# Patient Record
Sex: Female | Born: 1955 | Race: White | Hispanic: No | Marital: Married | Smoking: Current every day smoker
Health system: Southern US, Community
[De-identification: ages and names within clinical notes are randomized; demographics above are authoritative.]

## PROBLEM LIST (undated history)

## (undated) DIAGNOSIS — F419 Anxiety disorder, unspecified: Secondary | ICD-10-CM

## (undated) DIAGNOSIS — F32A Depression, unspecified: Secondary | ICD-10-CM

## (undated) DIAGNOSIS — I10 Essential (primary) hypertension: Secondary | ICD-10-CM

## (undated) DIAGNOSIS — G4730 Sleep apnea, unspecified: Secondary | ICD-10-CM

## (undated) DIAGNOSIS — J449 Chronic obstructive pulmonary disease, unspecified: Secondary | ICD-10-CM

## (undated) HISTORY — PX: ABDOMINAL HYSTERECTOMY: SHX81

---

## 2004-08-03 ENCOUNTER — Ambulatory Visit: Payer: Self-pay | Admitting: Family Medicine

## 2004-08-11 ENCOUNTER — Ambulatory Visit: Payer: Self-pay | Admitting: General Surgery

## 2005-07-27 ENCOUNTER — Ambulatory Visit: Payer: Self-pay | Admitting: Oncology

## 2005-08-09 LAB — CBC WITH DIFFERENTIAL (CANCER CENTER ONLY)
BASO#: 0.1 10*3/uL (ref 0.0–0.2)
EOS%: 2.3 % (ref 0.0–7.0)
HCT: 38.7 % (ref 34.8–46.6)
LYMPH#: 3.4 10*3/uL — ABNORMAL HIGH (ref 0.9–3.3)
NEUT%: 47.9 % (ref 39.6–80.0)
RDW: 11.6 % (ref 10.5–14.6)
WBC: 7.6 10*3/uL (ref 3.9–10.0)

## 2005-08-12 LAB — SPEP & IFE WITH QIG
Alpha-1-Globulin: 3.8 % (ref 2.9–4.9)
Alpha-2-Globulin: 10.6 % (ref 7.1–11.8)
Beta 2: 4.4 % (ref 3.2–6.5)
Beta Globulin: 5.2 % (ref 4.7–7.2)
Gamma Globulin: 16.2 % (ref 11.1–18.8)
IgA: 131 mg/dL (ref 68–378)
IgG (Immunoglobin G), Serum: 974 mg/dL (ref 694–1618)

## 2005-08-12 LAB — URIC ACID: Uric Acid, Serum: 6.1 mg/dL (ref 2.4–7.0)

## 2005-08-12 LAB — COMPREHENSIVE METABOLIC PANEL
AST: 20 U/L (ref 0–37)
Alkaline Phosphatase: 76 U/L (ref 39–117)
Calcium: 11.2 mg/dL — ABNORMAL HIGH (ref 8.4–10.5)
Chloride: 107 mEq/L (ref 96–112)

## 2005-08-12 LAB — KAPPA/LAMBDA LIGHT CHAINS
Kappa:Lambda Ratio: 0.94 (ref 0.26–1.65)
Lambda Free Lght Chn: 1.21 mg/dL (ref 0.57–2.63)

## 2005-08-12 LAB — BETA 2 MICROGLOBULIN, SERUM: Beta-2 Microglobulin: 1.5 mg/L (ref 1.01–1.73)

## 2005-08-13 ENCOUNTER — Ambulatory Visit (HOSPITAL_COMMUNITY): Admission: RE | Admit: 2005-08-13 | Discharge: 2005-08-13 | Payer: Self-pay | Admitting: Internal Medicine

## 2005-08-23 LAB — UIFE/LIGHT CHAINS/TP QN, 24-HR UR
Free Kappa/Lambda Ratio: 8.5 ratio — ABNORMAL HIGH (ref 0.46–4.00)
Free Lambda Excretion/Day: 2.52 mg/d
Free Lambda Lt Chains,Ur: 0.28 mg/dL (ref 0.08–1.01)
Time: 24 hours
Total Protein, Urine-Ur/day: 29 mg/d (ref 10–140)
Total Protein, Urine: 3.2 mg/dL
Volume, Urine: 900 mL

## 2007-12-01 ENCOUNTER — Ambulatory Visit: Payer: Self-pay | Admitting: Internal Medicine

## 2010-06-01 ENCOUNTER — Ambulatory Visit: Payer: Self-pay | Admitting: Rheumatology

## 2011-06-16 ENCOUNTER — Encounter: Payer: Self-pay | Admitting: Oncology

## 2011-06-16 NOTE — Progress Notes (Signed)
Put fmla form in the registration desk.

## 2011-10-13 ENCOUNTER — Ambulatory Visit: Payer: Self-pay | Admitting: Internal Medicine

## 2015-09-16 ENCOUNTER — Other Ambulatory Visit: Payer: Self-pay | Admitting: Internal Medicine

## 2015-09-16 DIAGNOSIS — Z1231 Encounter for screening mammogram for malignant neoplasm of breast: Secondary | ICD-10-CM

## 2015-10-02 ENCOUNTER — Ambulatory Visit: Payer: Self-pay

## 2015-10-13 ENCOUNTER — Other Ambulatory Visit: Payer: Self-pay | Admitting: Internal Medicine

## 2015-10-13 ENCOUNTER — Ambulatory Visit
Admission: RE | Admit: 2015-10-13 | Discharge: 2015-10-13 | Disposition: A | Discharge status: Home / Self Care | Payer: BC Managed Care – PPO | Source: Ambulatory Visit | Attending: Internal Medicine | Admitting: Internal Medicine

## 2015-10-13 DIAGNOSIS — Z1231 Encounter for screening mammogram for malignant neoplasm of breast: Secondary | ICD-10-CM

## 2015-10-13 DIAGNOSIS — R928 Other abnormal and inconclusive findings on diagnostic imaging of breast: Secondary | ICD-10-CM | POA: Diagnosis not present

## 2015-10-21 ENCOUNTER — Other Ambulatory Visit: Payer: Self-pay | Admitting: Internal Medicine

## 2015-10-21 DIAGNOSIS — N6310 Unspecified lump in the right breast, unspecified quadrant: Secondary | ICD-10-CM

## 2015-10-21 DIAGNOSIS — N6320 Unspecified lump in the left breast, unspecified quadrant: Secondary | ICD-10-CM

## 2015-11-03 ENCOUNTER — Ambulatory Visit
Admission: RE | Admit: 2015-11-03 | Discharge: 2015-11-03 | Disposition: A | Discharge status: Home / Self Care | Payer: BC Managed Care – PPO | Source: Ambulatory Visit | Attending: Internal Medicine | Admitting: Internal Medicine

## 2015-11-03 ENCOUNTER — Other Ambulatory Visit: Payer: Self-pay | Admitting: Internal Medicine

## 2015-11-03 DIAGNOSIS — N6310 Unspecified lump in the right breast, unspecified quadrant: Secondary | ICD-10-CM

## 2015-11-03 DIAGNOSIS — N63 Unspecified lump in breast: Secondary | ICD-10-CM | POA: Diagnosis not present

## 2015-11-03 DIAGNOSIS — N6320 Unspecified lump in the left breast, unspecified quadrant: Secondary | ICD-10-CM

## 2015-11-03 NOTE — Addendum Note (Signed)
Encounter addended by: Cammy CopaKristie L Shaquina Gillham, RT on: 11/03/2015 10:26 AM<BR>    Actions taken: Imaging Exam ended

## 2016-01-26 ENCOUNTER — Encounter (INDEPENDENT_AMBULATORY_CARE_PROVIDER_SITE_OTHER): Payer: BC Managed Care – PPO | Admitting: Vascular Surgery

## 2016-12-20 ENCOUNTER — Other Ambulatory Visit: Payer: Self-pay | Admitting: Internal Medicine

## 2016-12-20 DIAGNOSIS — Z1231 Encounter for screening mammogram for malignant neoplasm of breast: Secondary | ICD-10-CM

## 2017-09-26 ENCOUNTER — Other Ambulatory Visit
Admission: RE | Admit: 2017-09-26 | Discharge: 2017-09-26 | Disposition: A | Discharge status: Home / Self Care | Payer: BC Managed Care – PPO | Source: Ambulatory Visit | Attending: Orthopedic Surgery | Admitting: Orthopedic Surgery

## 2017-09-26 DIAGNOSIS — M170 Bilateral primary osteoarthritis of knee: Secondary | ICD-10-CM | POA: Insufficient documentation

## 2017-09-26 LAB — SYNOVIAL CELL COUNT + DIFF, W/ CRYSTALS
Eosinophils-Synovial: 0 %
LYMPHOCYTES-SYNOVIAL FLD: 66 %
MONOCYTE-MACROPHAGE-SYNOVIAL FLUID: 21 %
Neutrophil, Synovial: 13 %
Other Cells-SYN: 0
WBC, Synovial: 81 /mm3 (ref 0–200)

## 2017-09-29 LAB — BODY FLUID CULTURE: Culture: NO GROWTH

## 2018-11-16 ENCOUNTER — Encounter (INDEPENDENT_AMBULATORY_CARE_PROVIDER_SITE_OTHER): Payer: BC Managed Care – PPO | Admitting: Vascular Surgery

## 2018-12-04 ENCOUNTER — Encounter (INDEPENDENT_AMBULATORY_CARE_PROVIDER_SITE_OTHER): Payer: BC Managed Care – PPO | Admitting: Vascular Surgery

## 2018-12-04 NOTE — Progress Notes (Deleted)
MRN : [id number]  Lauren Lynch is a 63 y.o. ([birth year]) female who presents with chief complaint of No chief complaint on file. Lauren Lynch  History of Present Illness:   Patient is seen for evaluation of leg pain and leg swelling. The patient first noticed the swelling remotely. The swelling is associated with pain and discoloration. The pain and swelling worsens with prolonged dependency and improves with elevation. The pain is unrelated to activity.  The patient notes that in the morning the legs are significantly improved but they steadily worsened throughout the course of the day. The patient also notes a steady worsening of the discoloration in the ankle and shin area.   The patient denies claudication symptoms.  The patient denies symptoms consistent with rest pain.  The patient denies and extensive history of DJD and LS spine disease.  The patient has no had any past angiography, interventions or vascular surgery.  Elevation makes the leg symptoms better, dependency makes them much worse. There is no history of ulcerations. The patient denies any recent changes in medications.  The patient has not been wearing graduated compression.  The patient denies a history of DVT or PE. There is no prior history of phlebitis. There is no history of primary lymphedema.  No history of malignancies. No history of trauma or groin or pelvic surgery. There is no history of radiation treatment to the groin or pelvis  The patient denies amaurosis fugax or recent TIA symptoms. There are no recent neurological changes noted. The patient denies recent episodes of angina or shortness of breath  No outpatient medications have been marked as taking for the 12/04/18 encounter (Appointment) with Gilda Crease, Latina Craver, MD.    No past medical history on file.  No past surgical history on file.  Social History Social History   Tobacco Use  . Smoking status: Not on file  Substance Use Topics  . Alcohol  use: Not on file  . Drug use: Not on file    Family History Family History  Problem Relation Age of Onset  . Breast cancer Neg Hx   No family history of bleeding/clotting disorders, porphyria or autoimmune disease   Not on File   REVIEW OF SYSTEMS (Negative unless checked)  Constitutional: [] Weight loss  [] Fever  [] Chills Cardiac: [] Chest pain   [] Chest pressure   [] Palpitations   [] Shortness of breath when laying flat   [] Shortness of breath with exertion. Vascular:  [] Pain in legs with walking   [x] Pain in legs at rest  [] History of DVT   [] Phlebitis   [x] Swelling in legs   [] Varicose veins   [] Non-healing ulcers Pulmonary:   [] Uses home oxygen   [] Productive cough   [] Hemoptysis   [] Wheeze  [x] COPD   [] Asthma Neurologic:  [] Dizziness   [] Seizures   [] History of stroke   [] History of TIA  [] Aphasia   [] Vissual changes   [] Weakness or numbness in arm   [] Weakness or numbness in leg Musculoskeletal:   [] Joint swelling   [] Joint pain   [] Low back pain Hematologic:  [] Easy bruising  [] Easy bleeding   [] Hypercoagulable state   [] Anemic Gastrointestinal:  [] Diarrhea   [] Vomiting  [x] Gastroesophageal reflux/heartburn   [] Difficulty swallowing. Genitourinary:  [] Chronic kidney disease   [] Difficult urination  [] Frequent urination   [] Blood in urine Skin:  [] Rashes   [] Ulcers  Psychological:  [] History of anxiety   []  History of major depression.  Physical Examination  There were no vitals filed for this visit. There  is no height or weight on file to calculate BMI. Gen: WD/WN, NAD Head: Farrell/AT, No temporalis wasting.  Ear/Nose/Throat: Hearing grossly intact, nares w/o erythema or drainage, poor dentition Eyes: PER, EOMI, sclera nonicteric.  Neck: Supple, no masses.  No bruit or JVD.  Pulmonary:  Good air movement, clear to auscultation bilaterally, no use of accessory muscles.  Cardiac: RRR, normal S1, S2, no Murmurs. Vascular: *** Vessel Right Left  Radial Palpable Palpable  Ulnar  Palpable Palpable  Brachial Palpable Palpable  Carotid Palpable Palpable  Femoral Palpable Palpable  Popliteal Palpable Palpable  PT Palpable Palpable  DP Palpable Palpable   Gastrointestinal: soft, non-distended. No guarding/no peritoneal signs.  Musculoskeletal: M/S 5/5 throughout.  No deformity or atrophy.  Neurologic: CN 2-12 intact. Pain and light touch intact in extremities.  Symmetrical.  Speech is fluent. Motor exam as listed above. Psychiatric: Judgment intact, Mood & affect appropriate for pt's clinical situation. Dermatologic: No rashes or ulcers noted.  No changes consistent with cellulitis. Lymph : No Cervical lymphadenopathy, no lichenification or skin changes of chronic lymphedema.  CBC Lab Results  Component Value Date   WBC 7.6 08/09/2005   HGB 13.0 08/09/2005   HCT 38.7 08/09/2005   MCV 88 08/09/2005   PLT 211 08/09/2005    BMET    Component Value Date/Time   NA 139 08/09/2005 1309   K 3.6 08/09/2005 1309   CL 107 08/09/2005 1309   CO2 24 08/09/2005 1309   GLUCOSE 195 (H) 08/09/2005 1309   BUN 10 08/09/2005 1309   CREATININE 0.90 08/09/2005 1309   CALCIUM 11.2 (H) 08/09/2005 1309   CrCl cannot be calculated (Patient's most recent lab result is older than the maximum 21 days allowed.).  COAG No results found for: INR, PROTIME  Radiology No results found.   Assessment/Plan There are no diagnoses linked to this encounter.   Hortencia Pilar, MD  12/04/2018 8:32 AM

## 2018-12-11 ENCOUNTER — Other Ambulatory Visit: Payer: Self-pay | Admitting: Family

## 2018-12-11 DIAGNOSIS — Z1231 Encounter for screening mammogram for malignant neoplasm of breast: Secondary | ICD-10-CM

## 2019-04-28 ENCOUNTER — Ambulatory Visit: Payer: BC Managed Care – PPO | Attending: Internal Medicine

## 2019-04-28 DIAGNOSIS — Z23 Encounter for immunization: Secondary | ICD-10-CM | POA: Insufficient documentation

## 2019-04-28 NOTE — Progress Notes (Signed)
   Covid-19 Vaccination Clinic  Name:  Lauren Lynch    MRN: [id number] DOB: 1956-01-12  04/28/2019  Ms. Curb was observed post Covid-19 immunization for 15 minutes without incident. She was provided with Vaccine Information Sheet and instruction to access the V-Safe system.   [name] was instructed to call 911 with any severe reactions post vaccine: [name] Difficulty breathing  . Swelling of face and throat  . A fast heartbeat  . A bad rash all over body  . Dizziness and weakness   Immunizations Administered    Name Date Dose VIS Date Route   Moderna COVID-19 Vaccine 04/28/2019  1:53 PM 0.5 mL 01/23/2019 Intramuscular   Manufacturer: Moderna   Lot: [id number]   NDC: 09470-962-83

## 2019-05-26 ENCOUNTER — Ambulatory Visit: Payer: BC Managed Care – PPO | Attending: Internal Medicine

## 2019-05-26 DIAGNOSIS — Z23 Encounter for immunization: Secondary | ICD-10-CM

## 2019-05-26 NOTE — Progress Notes (Signed)
   Covid-19 Vaccination Clinic  Name:  Shelsy Seng    MRN: [id number] DOB: 06-21-1955  05/26/2019  Ms. Dominique was observed post Covid-19 immunization for 15 minutes without incident. She was provided with Vaccine Information Sheet and instruction to access the V-Safe system.   Ms. Venturi was instructed to call 911 with any severe reactions post vaccine: [name] Difficulty breathing  . Swelling of face and throat  . A fast heartbeat  . A bad rash all over body  . Dizziness and weakness   Immunizations Administered    Name Date Dose VIS Date Route   Moderna COVID-19 Vaccine 05/26/2019  8:48 AM 0.5 mL 01/23/2019 Intramuscular   Manufacturer: Gala Murdoch   Lot: [id number]   NDC: 73543-014-84

## 2019-11-06 ENCOUNTER — Encounter: Payer: Self-pay | Admitting: Emergency Medicine

## 2019-11-06 ENCOUNTER — Emergency Department: Payer: BC Managed Care – PPO

## 2019-11-06 DIAGNOSIS — I10 Essential (primary) hypertension: Secondary | ICD-10-CM | POA: Insufficient documentation

## 2019-11-06 DIAGNOSIS — E876 Hypokalemia: Secondary | ICD-10-CM | POA: Insufficient documentation

## 2019-11-06 DIAGNOSIS — R000 Tachycardia, unspecified: Secondary | ICD-10-CM | POA: Diagnosis not present

## 2019-11-06 DIAGNOSIS — M546 Pain in thoracic spine: Secondary | ICD-10-CM | POA: Diagnosis present

## 2019-11-06 DIAGNOSIS — F17200 Nicotine dependence, unspecified, uncomplicated: Secondary | ICD-10-CM | POA: Diagnosis not present

## 2019-11-06 DIAGNOSIS — J449 Chronic obstructive pulmonary disease, unspecified: Secondary | ICD-10-CM | POA: Diagnosis not present

## 2019-11-06 LAB — CBC
HCT: 39.7 % (ref 36.0–46.0)
Hemoglobin: 13.8 g/dL (ref 12.0–15.0)
MCH: 28.5 pg (ref 26.0–34.0)
MCHC: 34.8 g/dL (ref 30.0–36.0)
MCV: 82 fL (ref 80.0–100.0)
Platelets: 298 10*3/uL (ref 150–400)
RBC: 4.84 MIL/uL (ref 3.87–5.11)
RDW: 13.9 % (ref 11.5–15.5)
WBC: 15.6 10*3/uL — ABNORMAL HIGH (ref 4.0–10.5)
nRBC: 0 % (ref 0.0–0.2)

## 2019-11-06 LAB — BASIC METABOLIC PANEL
Anion gap: 13 (ref 5–15)
BUN: 29 mg/dL — ABNORMAL HIGH (ref 8–23)
CO2: 26 mmol/L (ref 22–32)
Calcium: 10.8 mg/dL — ABNORMAL HIGH (ref 8.9–10.3)
Chloride: 100 mmol/L (ref 98–111)
Creatinine, Ser: 0.86 mg/dL (ref 0.44–1.00)
GFR calc Af Amer: >60 mL/min (ref >60)
GFR calc non Af Amer: >60 mL/min (ref >60)
Glucose, Bld: 142 mg/dL — ABNORMAL HIGH (ref 70–99)
Potassium: 2.7 mmol/L — CL (ref 3.5–5.1)
Sodium: 139 mmol/L (ref 135–145)

## 2019-11-06 LAB — HEPATIC FUNCTION PANEL
ALT: 17 U/L (ref 0–44)
AST: 17 U/L (ref 15–41)
Albumin: 4.5 g/dL (ref 3.5–5.0)
Alkaline Phosphatase: 80 U/L (ref 38–126)
Bilirubin, Direct: <0.1 mg/dL (ref 0.0–0.2)
Total Bilirubin: 0.6 mg/dL (ref 0.3–1.2)
Total Protein: 8.6 g/dL — ABNORMAL HIGH (ref 6.5–8.1)

## 2019-11-06 LAB — TROPONIN I (HIGH SENSITIVITY): Troponin I (High Sensitivity): 9 ng/L (ref <18)

## 2019-11-06 MED ORDER — IOHEXOL 350 MG/ML SOLN
100.0000 mL | Freq: Once | INTRAVENOUS | Status: AC | PRN
  Administered 2019-11-06: 100 mL via INTRAVENOUS

## 2019-11-06 NOTE — ED Triage Notes (Signed)
Pt arrived via EMS from home where pt started having sudden onset of central chest pain that radiates into back, in between shoulder blades. Pt in triage diaphoretic and increased RR. Pain 10/10. Pt denies lung or cardiac hx.

## 2019-11-07 ENCOUNTER — Emergency Department
Admission: EM | Admit: 2019-11-07 | Discharge: 2019-11-07 | Disposition: A | Discharge status: Home / Self Care | Payer: BC Managed Care – PPO | Attending: Emergency Medicine | Admitting: Emergency Medicine

## 2019-11-07 DIAGNOSIS — M546 Pain in thoracic spine: Secondary | ICD-10-CM

## 2019-11-07 DIAGNOSIS — E876 Hypokalemia: Secondary | ICD-10-CM

## 2019-11-07 DIAGNOSIS — R000 Tachycardia, unspecified: Secondary | ICD-10-CM

## 2019-11-07 HISTORY — DX: Chronic obstructive pulmonary disease, unspecified: J44.9

## 2019-11-07 HISTORY — DX: Essential (primary) hypertension: I10

## 2019-11-07 LAB — TROPONIN I (HIGH SENSITIVITY): Troponin I (High Sensitivity): 7 ng/L (ref <18)

## 2019-11-07 LAB — LIPASE, BLOOD: Lipase: 31 U/L (ref 11–51)

## 2019-11-07 MED ORDER — KETOROLAC TROMETHAMINE 30 MG/ML IJ SOLN
15.0000 mg | Freq: Once | INTRAMUSCULAR | Status: AC
  Administered 2019-11-07: 15 mg via INTRAVENOUS
  Filled 2019-11-07: qty 1

## 2019-11-07 MED ORDER — POTASSIUM CHLORIDE 10 MEQ/100ML IV SOLN
10.0000 meq | Freq: Once | INTRAVENOUS | Status: AC
  Administered 2019-11-07: 10 meq via INTRAVENOUS
  Filled 2019-11-07: qty 100

## 2019-11-07 MED ORDER — POTASSIUM CHLORIDE 20 MEQ PO PACK
40.0000 meq | PACK | Freq: Once | ORAL | Status: AC
  Administered 2019-11-07: 40 meq via ORAL
  Filled 2019-11-07: qty 2

## 2019-11-07 NOTE — ED Provider Notes (Signed)
[facility] Emergency Department Provider Note ____________________________________________   First MD Initiated Contact with Patient 11/07/19 [phone]     (approximate)  I have reviewed the triage vital signs and the nursing notes.  HISTORY  Chief Complaint Back pain  HPI Lauren Lynch is a 64 y.o. femalewho presents to the ED for evaluation of back pain.   Chart review indicates hx HTN, HLD and COPD.  Patient states her waiting room for approximately 12 hours prior to my evaluation due to prolonged wait times. No history of cardiac or coronary disease.  Last night at about 9 PM patient reports being in her typical state of health until she developed thoracic back pain while laying down.  She reports this was acute onset without precipitating trauma or events.  She reports the pain has been constant since last night, over the past 13 hours or so.  She reports the pain was initially 10/10 severity, aching/cramping and nonradiating.  She reports that she felt "some chest tingles" when she took a deep breath last night, but this has resolved.  She is now reporting gradual improvement of the pain over the past 12 hours, now 4/10 severity.  Besides her mild pleuritic discomfort last night, she denies any associated symptoms.  Denies vomiting, nausea, shortness of breath, increased pedal production, fevers, syncope, abdominal pain, diarrhea, dysuria or hematuria.  Upon my initial evaluation, patient is already asking to go home, indicating upcoming travel in a couple days.  Past Medical History:  Diagnosis Date  . COPD (chronic obstructive pulmonary disease) (HCC)   . Hypertension     There are no problems to display for this patient.   History reviewed. No pertinent surgical history.  Prior to Admission medications   Not on File    Allergies Patient has no known allergies.  Family History  Problem Relation Age of Onset  . Breast cancer Neg Hx      Social History Social History   Tobacco Use  . Smoking status: Current Every Day Smoker  . Smokeless tobacco: Never Used  Substance Use Topics  . Alcohol use: Not on file  . Drug use: Not on file    Review of Systems  Constitutional: No fever/chills Eyes: No visual changes. ENT: No sore throat. Cardiovascular: Positive for chest pain. Respiratory: Denies shortness of breath. Gastrointestinal: No abdominal pain.  No nausea, no vomiting.  No diarrhea.  No constipation. Genitourinary: Negative for dysuria. Musculoskeletal: Positive for back pain Skin: Negative for rash. Neurological: Negative for headaches, focal weakness or numbness.   ____________________________________________   PHYSICAL EXAM:  VITAL SIGNS: Vitals:   11/07/19 1000 11/07/19 1030  BP: 138/80 136/74  Pulse: 97 87  Resp: 20 (!) 21  Temp:    SpO2: 92% 92%      Constitutional: Alert and oriented. Well appearing and in no acute distress. Eyes: Conjunctivae are normal. PERRL. EOMI. Head: Atraumatic. Nose: No congestion/rhinnorhea. Mouth/Throat: Mucous membranes are moist.  Oropharynx non-erythematous. Neck: No stridor. No cervical spine tenderness to palpation. Cardiovascular: Normal rate, regular rhythm. Grossly normal heart sounds.  Good peripheral circulation. Respiratory: Normal respiratory effort.  No retractions. Lungs CTAB. Gastrointestinal: Soft , nondistended, nontender to palpation. No abdominal bruits. No CVA tenderness. Musculoskeletal: No lower extremity tenderness nor edema.  No joint effusions. No signs of acute trauma. Examination of the back is unremarkable.  No signs of trauma or skin changes to the area in question between her shoulder blades.  No spinal step-offs throughout.  No muscular knots, induration, fluctuance or areas of focal tenderness upon palpation. Neurologic:  Normal speech and language. No gross focal neurologic deficits are appreciated. No gait instability  noted. Cranial nerves II through XII intact 5/5 strength and sensation in all 4 extremities Skin:  Skin is warm, dry and intact. No rash noted. Psychiatric: Mood and affect are normal. Speech and behavior are normal.  ____________________________________________   LABS (all labs ordered are listed, but only abnormal results are displayed)  Labs Reviewed  BASIC METABOLIC PANEL - Abnormal; Notable for the following components:      Result Value   Potassium 2.7 (*)    Glucose, Bld 142 (*)    BUN 29 (*)    Calcium 10.8 (*)    All other components within normal limits  CBC - Abnormal; Notable for the following components:   WBC 15.6 (*)    All other components within normal limits  HEPATIC FUNCTION PANEL - Abnormal; Notable for the following components:   Total Protein 8.6 (*)    All other components within normal limits  LIPASE, BLOOD  TROPONIN I (HIGH SENSITIVITY)  TROPONIN I (HIGH SENSITIVITY)   ____________________________________________  12 Lead EKG  Sinus rhythm, rate of 111 bpm.  Normal axis and intervals.  No evidence of acute ischemia. ____________________________________________  RADIOLOGY  ED MD interpretation: 2 view CXR reviewed without evidence of acute cardiopulmonary pathology.  Official radiology report(s): DG Chest 2 View  Result Date: 11/06/2019 CLINICAL DATA:  Chest pain EXAM: CHEST - 2 VIEW COMPARISON:  None. FINDINGS: The lungs are symmetrically well expanded. Minimal lingular atelectasis or infiltrate. Lungs are otherwise clear. No pneumothorax or pleural effusion. Cardiac size is within normal limits. Pulmonary vascularity is normal. No acute bone abnormality. IMPRESSION: Minimal lingular atelectasis or infiltrate. Electronically Signed   By: Helyn Numbers MD   On: 11/06/2019 22:47   CT ANGIO CHEST AORTA W/CM & OR WO/CM  Result Date: 11/06/2019 CLINICAL DATA:  Central chest pain radiating into the back EXAM: CT ANGIOGRAPHY CHEST WITH CONTRAST  TECHNIQUE: Multidetector CT imaging of the chest was performed using the standard protocol during bolus administration of intravenous contrast. Multiplanar CT image reconstructions and MIPs were obtained to evaluate the vascular anatomy. CONTRAST:  OMNIPAQUE IOHEXOL 350 MG/ML SOLN COMPARISON:  None. FINDINGS: CTA CHEST FINDINGS Cardiovascular: --Heart: The heart size is normal.  There is nopericardial effusion. --Aorta: The course and caliber of the thoracic aorta are normal. There is scattered aortic atherosclerotic calcification. Precontrast images show no aortic intramural hematoma. There is no blood pool, dissection or penetrating ulcer demonstrated on arterial phase postcontrast imaging. There is a conventional 3 vessel aortic arch branching pattern. The proximal arch vessels are widely patent. --Pulmonary Arteries: Contrast timing is optimized for preferential opacification of the aorta. Within that limitation, normal central pulmonary arteries. Mediastinum/Nodes: No mediastinal, hilar or axillary lymphadenopathy. The visualized thyroid and thoracic esophageal course are unremarkable. Lungs/Pleura: No pulmonary nodules or masses. No pleural effusion or pneumothorax. No focal airspace consolidation. No focal pleural abnormality. Musculoskeletal: No chest wall abnormality. No acute osseous findings. Review of the MIP images confirms the above findings. IMPRESSION: No acute aortic abnormality. No other acute intrathoracic pathology to explain the patient's symptoms. Aortic Atherosclerosis (ICD10-I70.0). Electronically Signed   By: Jonna Clark M.D.   On: 11/06/2019 23:50    ____________________________________________   PROCEDURES and INTERVENTIONS  Procedure(s) performed (including Critical Care):  Procedures  Medications  potassium chloride 10 mEq in 100 mL IVPB (10 mEq  Intravenous New Bag/Given 11/07/19 0958)  iohexol (OMNIPAQUE) 350 MG/ML injection 100 mL (100 mLs Intravenous Contrast Given  11/06/19 2337)  potassium chloride (KLOR-CON) packet 40 mEq (40 mEq Oral Given 11/07/19 0955)  ketorolac (TORADOL) 30 MG/ML injection 15 mg (15 mg Intravenous Given 11/07/19 1029)    ____________________________________________   MDM / ED COURSE  64 year old woman presents with acute upper back pain, most consistent with muscular spasm precipitated by hypokalemia, and amenable to outpatient management with PCP follow-up.  Patient presented with mild and persistent sinus tachycardia, resolving after potassium supplementation and analgesia, vitals otherwise normal on room air.  Exam reassuring without evidence of acute derangements or well-appearing patient.  She has no distress, neurovascular deficits or signs of precipitating trauma.  Blood work demonstrates hypokalemia to 2.7, otherwise unremarkable.  EKG is nonischemic and troponin is negative.  Due to her rapid heart rate and site of pain, CTA chest obtained and without evidence of dissection, AAA or acute cardiopulmonary pathology as the source of her symptoms.  Patient provided potassium supplementation p.o. and IV, as well as single dose of Toradol with resolution of her symptoms.  Tachycardia resolved after this, patient reports resolution of symptoms and is eager to go home, which I think is reasonable..  I see no evidence of further acute pathology beyond her hypokalemia to warrant additional advanced imaging or hospitalization.   We discussed following up with PCP for a potassium recheck, and the possibility of prolonged supplementation in the setting of her diuretic usage.  We discussed return precautions for the ED.  Patient medically stable for discharge home.  Clinical Course as of Nov 06 1053  Wed Nov 07, 2019  1052 Reassessed.  Patient reports resolving symptoms after Toradol and potassium supplementation.  We discussed return precautions for the ED.  Patient eager for discharge, which I think is reasonable. Tachycardia and pain resolved.   Patient reports feeling normal now.   [DS]    Clinical Course User Index [DS] Delton Prairie, MD     ____________________________________________   FINAL CLINICAL IMPRESSION(S) / ED DIAGNOSES  Final diagnoses:  Hypokalemia  Sinus tachycardia  Acute bilateral thoracic back pain     ED Discharge Orders    None       Jevon Littlepage   Note:  This document was prepared using Dragon voice recognition software and may include unintentional dictation errors.   Delton Prairie, MD 11/07/19 1059

## 2019-11-07 NOTE — Discharge Instructions (Signed)
You were seen in the ED because of your back pain.  We did blood work, CAT scans and heart testing that showed low potassium, and as we discussed, this is likely the source of your pain.  Please take Tylenol and ibuprofen/Advil for your pain.  It is safe to take them together, or to alternate them every few hours.  Take up to 1000mg  of Tylenol at a time, up to 4 times per day.  Do not take more than 4000 mg of Tylenol in 24 hours.  For ibuprofen, take 400-600 mg, 4-5 times per day.  Please follow-up with your primary care doctor within the next week or so to have your potassium rechecked.  You may need to start potassium supplementation because of the blood pressure medicine that you are on.  If you develop any worsening pain, especially combination with passing out, fevers or frontal chest pain, please return to the ED.

## 2020-01-15 ENCOUNTER — Other Ambulatory Visit: Payer: Self-pay | Admitting: Internal Medicine

## 2020-01-15 DIAGNOSIS — Z1231 Encounter for screening mammogram for malignant neoplasm of breast: Secondary | ICD-10-CM

## 2020-03-05 ENCOUNTER — Ambulatory Visit
Admission: RE | Admit: 2020-03-05 | Discharge: 2020-03-05 | Disposition: A | Discharge status: Home / Self Care | Payer: BC Managed Care – PPO | Source: Ambulatory Visit | Attending: Internal Medicine | Admitting: Internal Medicine

## 2020-03-05 ENCOUNTER — Other Ambulatory Visit: Payer: Self-pay

## 2020-03-05 DIAGNOSIS — Z1231 Encounter for screening mammogram for malignant neoplasm of breast: Secondary | ICD-10-CM | POA: Insufficient documentation

## 2021-05-14 DIAGNOSIS — H00011 Hordeolum externum right upper eyelid: Secondary | ICD-10-CM | POA: Diagnosis not present

## 2021-06-03 DIAGNOSIS — E785 Hyperlipidemia, unspecified: Secondary | ICD-10-CM | POA: Diagnosis not present

## 2021-06-03 DIAGNOSIS — I10 Essential (primary) hypertension: Secondary | ICD-10-CM | POA: Diagnosis not present

## 2021-06-03 DIAGNOSIS — E559 Vitamin D deficiency, unspecified: Secondary | ICD-10-CM | POA: Diagnosis not present

## 2021-08-04 DIAGNOSIS — Z0001 Encounter for general adult medical examination with abnormal findings: Secondary | ICD-10-CM | POA: Diagnosis not present

## 2021-08-04 DIAGNOSIS — J449 Chronic obstructive pulmonary disease, unspecified: Secondary | ICD-10-CM | POA: Diagnosis not present

## 2021-08-04 DIAGNOSIS — I10 Essential (primary) hypertension: Secondary | ICD-10-CM | POA: Diagnosis not present

## 2021-08-04 DIAGNOSIS — F17210 Nicotine dependence, cigarettes, uncomplicated: Secondary | ICD-10-CM | POA: Diagnosis not present

## 2021-08-04 DIAGNOSIS — E668 Other obesity: Secondary | ICD-10-CM | POA: Diagnosis not present

## 2021-08-04 DIAGNOSIS — E785 Hyperlipidemia, unspecified: Secondary | ICD-10-CM | POA: Diagnosis not present

## 2021-08-04 DIAGNOSIS — E876 Hypokalemia: Secondary | ICD-10-CM | POA: Diagnosis not present

## 2021-08-04 DIAGNOSIS — S338XXA Sprain of other parts of lumbar spine and pelvis, initial encounter: Secondary | ICD-10-CM | POA: Diagnosis not present

## 2021-08-04 DIAGNOSIS — Z1331 Encounter for screening for depression: Secondary | ICD-10-CM | POA: Diagnosis not present

## 2021-11-04 IMAGING — MG DIGITAL SCREENING BILAT W/ TOMO W/ CAD
6 of 12 series · 6 of 36 positions shown · non-contrast
Comparison: Previous exam(s).

CLINICAL DATA: Screening.

EXAM:
DIGITAL SCREENING BILATERAL MAMMOGRAM WITH TOMO AND CAD

[R CC synth-2D]
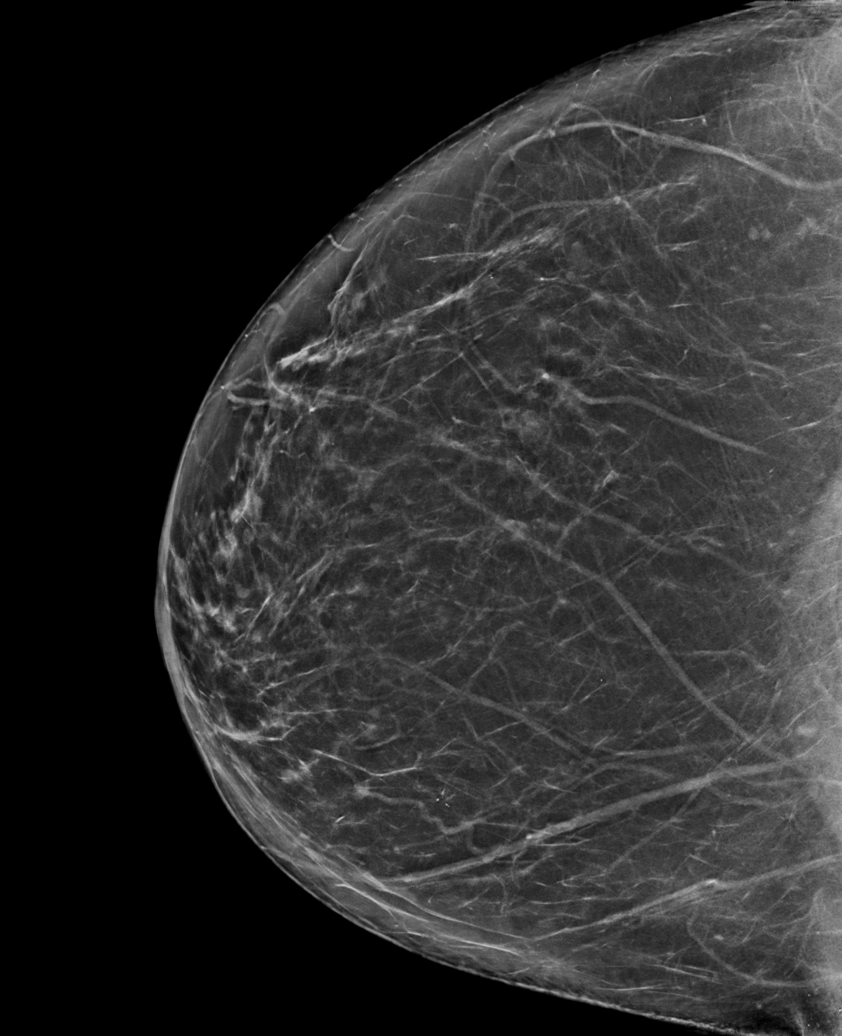

[L MLO synth-2D]
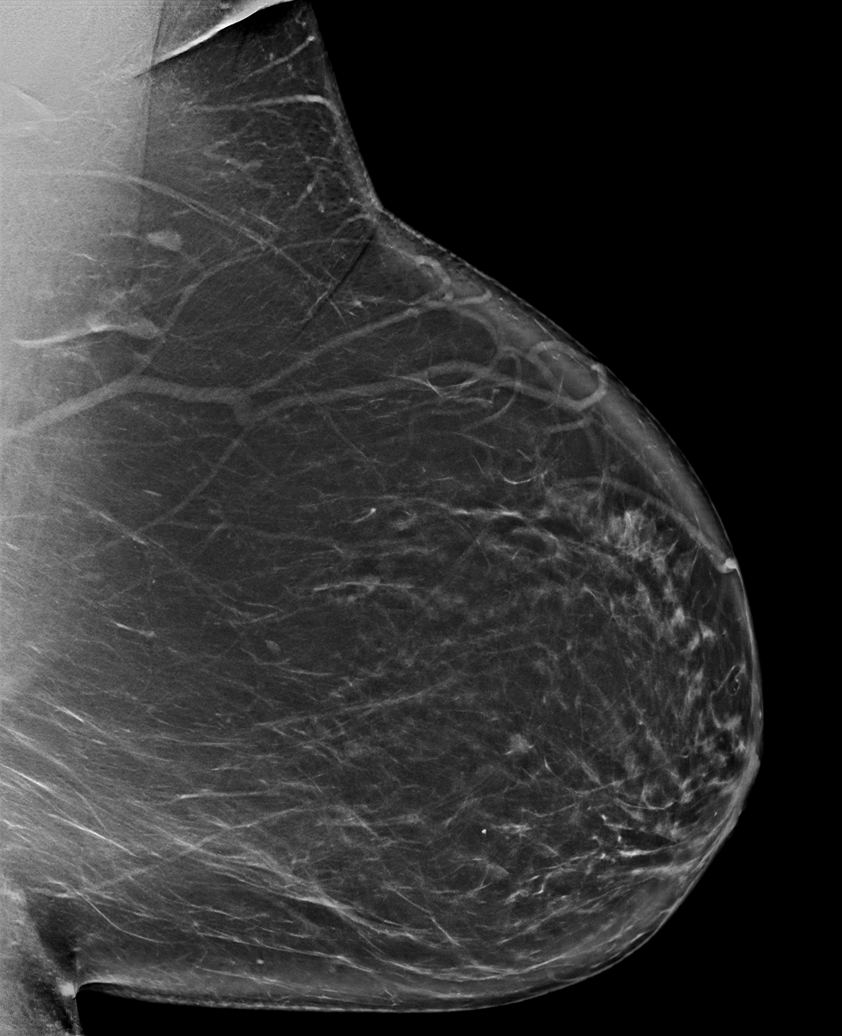

[R MLO synth-2D]
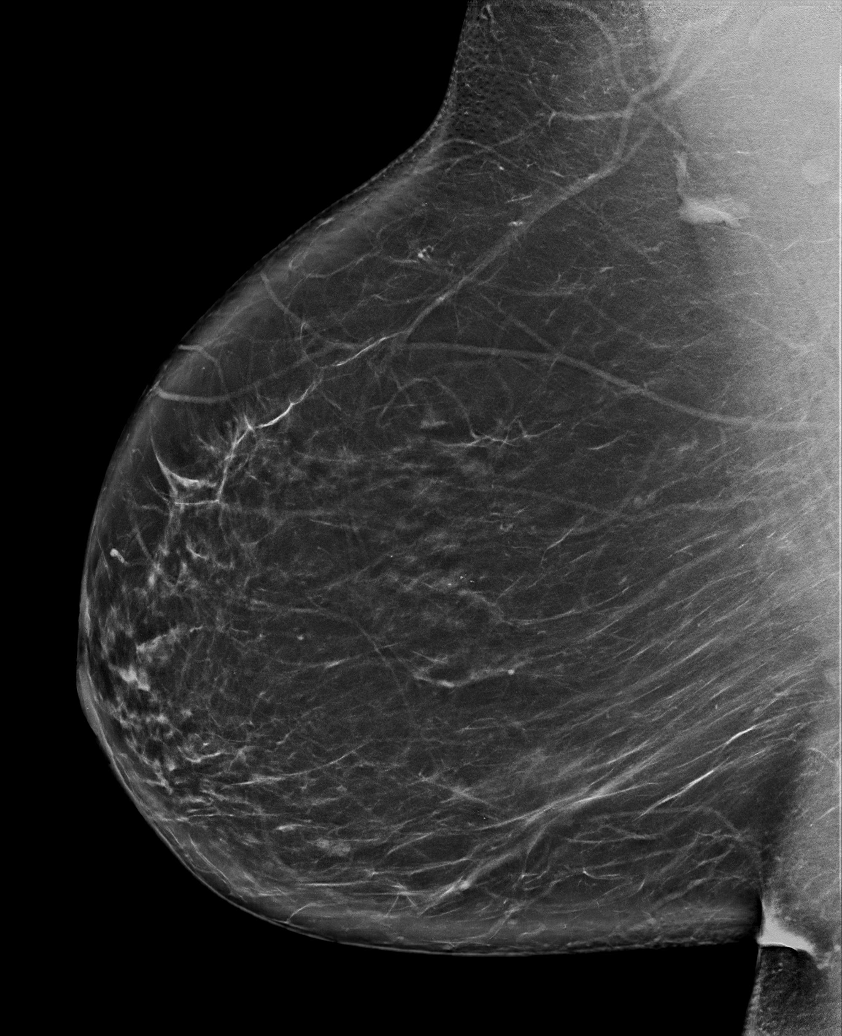

[L XCCL synth-2D]
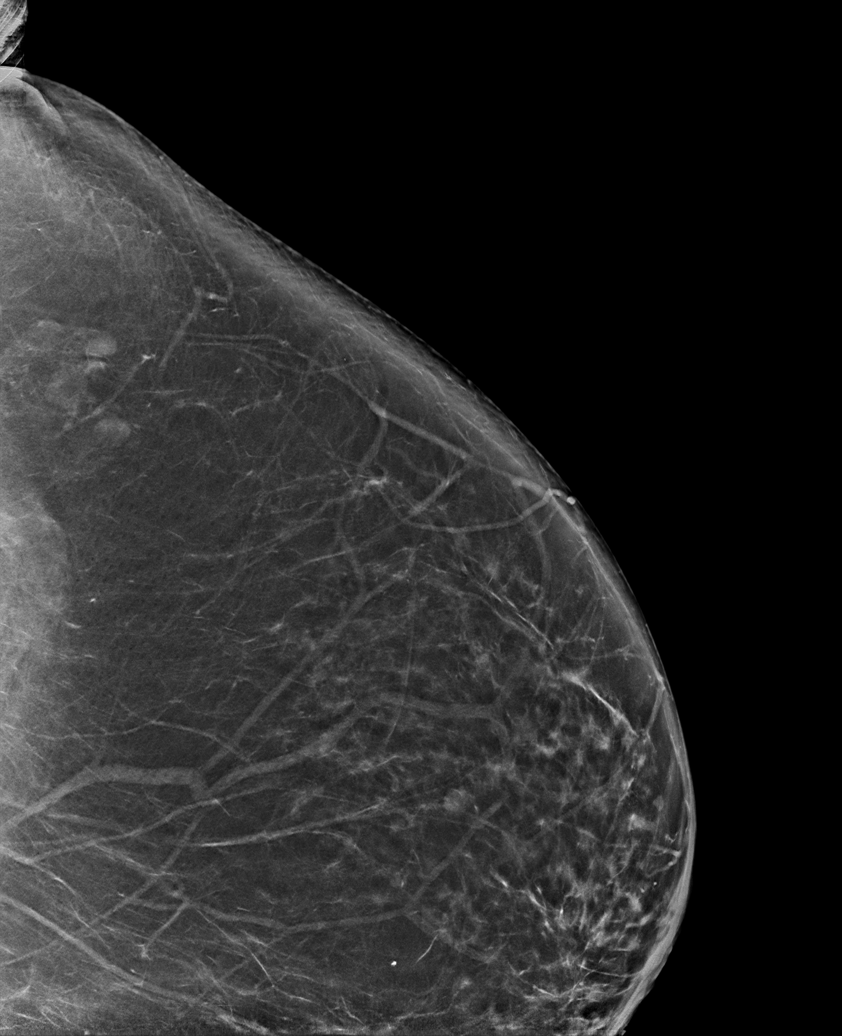

[R XCCM synth-2D]
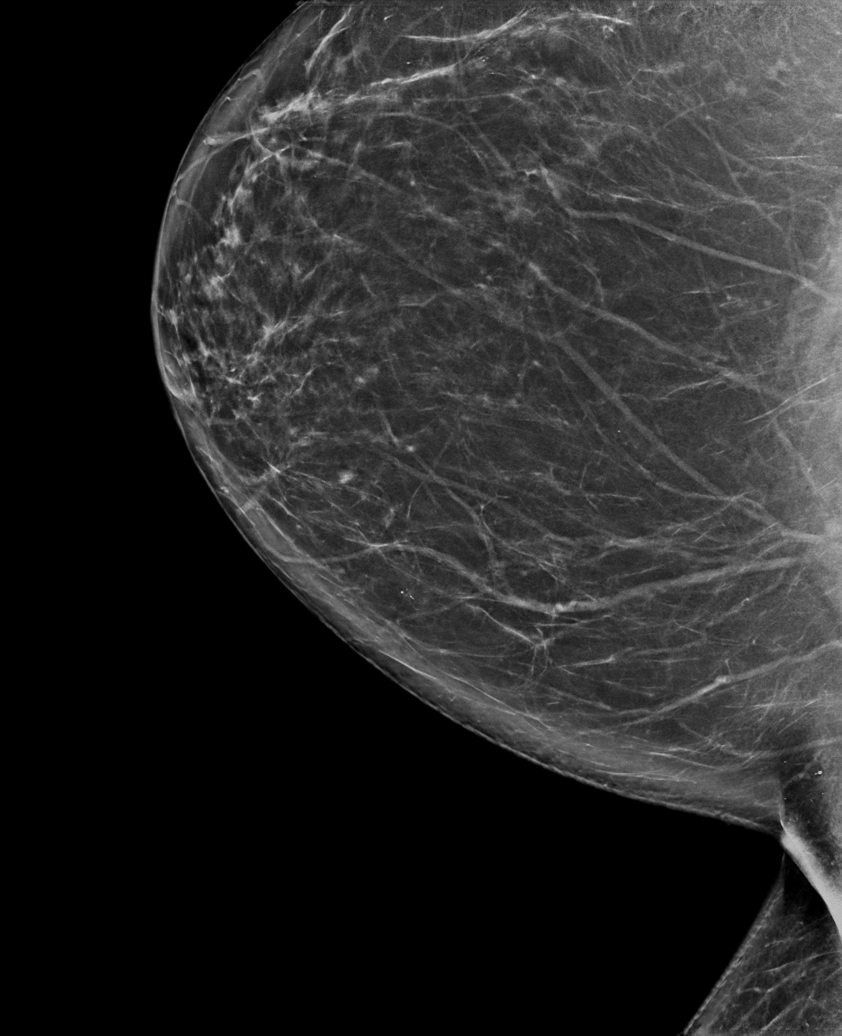

[L CC synth-2D]
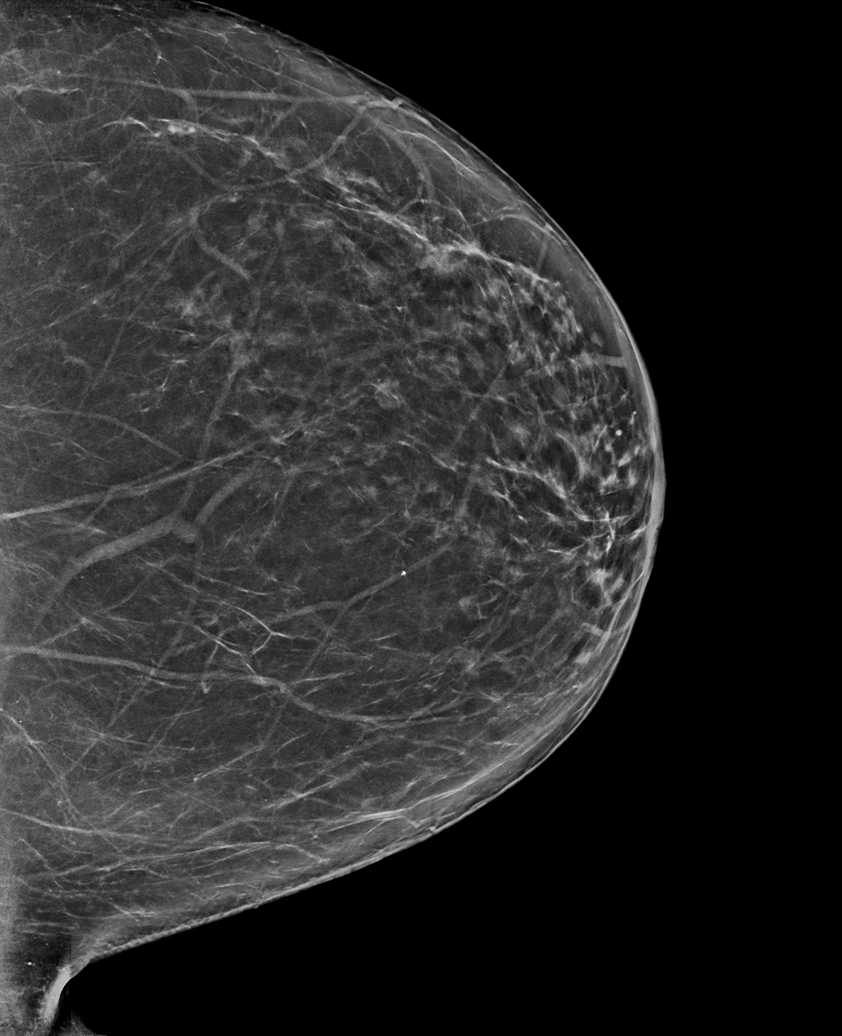

[6 of 36 positions shown; findings below may reference images not displayed]

ACR Breast Density Category b: There are scattered areas of
fibroglandular density.
FINDINGS: There are no findings suspicious for malignancy. Images were
processed with CAD.
IMPRESSION: No mammographic evidence of malignancy. A result letter of this
screening mammogram will be mailed directly to the patient.

RECOMMENDATION:
Screening mammogram in one year. (Code:[id number])

BI-RADS CATEGORY  1: Negative.

## 2021-11-05 DIAGNOSIS — E785 Hyperlipidemia, unspecified: Secondary | ICD-10-CM | POA: Diagnosis not present

## 2021-11-13 DIAGNOSIS — E876 Hypokalemia: Secondary | ICD-10-CM | POA: Diagnosis not present

## 2021-11-13 DIAGNOSIS — F17210 Nicotine dependence, cigarettes, uncomplicated: Secondary | ICD-10-CM | POA: Diagnosis not present

## 2021-11-13 DIAGNOSIS — S338XXA Sprain of other parts of lumbar spine and pelvis, initial encounter: Secondary | ICD-10-CM | POA: Diagnosis not present

## 2021-11-13 DIAGNOSIS — R609 Edema, unspecified: Secondary | ICD-10-CM | POA: Diagnosis not present

## 2021-11-13 DIAGNOSIS — E785 Hyperlipidemia, unspecified: Secondary | ICD-10-CM | POA: Diagnosis not present

## 2021-11-13 DIAGNOSIS — J449 Chronic obstructive pulmonary disease, unspecified: Secondary | ICD-10-CM | POA: Diagnosis not present

## 2021-11-13 DIAGNOSIS — E668 Other obesity: Secondary | ICD-10-CM | POA: Diagnosis not present

## 2021-11-13 DIAGNOSIS — I10 Essential (primary) hypertension: Secondary | ICD-10-CM | POA: Diagnosis not present

## 2021-11-13 DIAGNOSIS — I872 Venous insufficiency (chronic) (peripheral): Secondary | ICD-10-CM | POA: Diagnosis not present

## 2022-02-09 DIAGNOSIS — E785 Hyperlipidemia, unspecified: Secondary | ICD-10-CM | POA: Diagnosis not present

## 2022-02-09 DIAGNOSIS — R051 Acute cough: Secondary | ICD-10-CM | POA: Diagnosis not present

## 2022-02-09 DIAGNOSIS — E559 Vitamin D deficiency, unspecified: Secondary | ICD-10-CM | POA: Diagnosis not present

## 2022-02-09 DIAGNOSIS — J321 Chronic frontal sinusitis: Secondary | ICD-10-CM | POA: Diagnosis not present

## 2022-02-12 DIAGNOSIS — I872 Venous insufficiency (chronic) (peripheral): Secondary | ICD-10-CM | POA: Diagnosis not present

## 2022-02-12 DIAGNOSIS — I10 Essential (primary) hypertension: Secondary | ICD-10-CM | POA: Diagnosis not present

## 2022-02-12 DIAGNOSIS — E876 Hypokalemia: Secondary | ICD-10-CM | POA: Diagnosis not present

## 2022-02-12 DIAGNOSIS — E785 Hyperlipidemia, unspecified: Secondary | ICD-10-CM | POA: Diagnosis not present

## 2022-02-12 DIAGNOSIS — S338XXA Sprain of other parts of lumbar spine and pelvis, initial encounter: Secondary | ICD-10-CM | POA: Diagnosis not present

## 2022-02-12 DIAGNOSIS — J449 Chronic obstructive pulmonary disease, unspecified: Secondary | ICD-10-CM | POA: Diagnosis not present

## 2022-02-12 DIAGNOSIS — R609 Edema, unspecified: Secondary | ICD-10-CM | POA: Diagnosis not present

## 2022-02-12 DIAGNOSIS — E668 Other obesity: Secondary | ICD-10-CM | POA: Diagnosis not present

## 2022-02-12 DIAGNOSIS — Z23 Encounter for immunization: Secondary | ICD-10-CM | POA: Diagnosis not present

## 2022-02-12 DIAGNOSIS — F17210 Nicotine dependence, cigarettes, uncomplicated: Secondary | ICD-10-CM | POA: Diagnosis not present

## 2022-04-05 ENCOUNTER — Other Ambulatory Visit: Payer: Self-pay | Admitting: Internal Medicine

## 2022-04-05 MED ORDER — VITAMIN D-3 125 MCG (5000 UT) PO TABS: 5000.0000 [IU] | ORAL_TABLET | Freq: Every day | ORAL | 3 refills | Status: DC

## 2022-05-08 ENCOUNTER — Other Ambulatory Visit: Payer: Self-pay | Admitting: Internal Medicine

## 2022-05-11 ENCOUNTER — Other Ambulatory Visit: Payer: Medicare PPO

## 2022-05-11 DIAGNOSIS — E785 Hyperlipidemia, unspecified: Secondary | ICD-10-CM | POA: Diagnosis not present

## 2022-05-11 DIAGNOSIS — I10 Essential (primary) hypertension: Secondary | ICD-10-CM

## 2022-05-11 DIAGNOSIS — E668 Other obesity: Secondary | ICD-10-CM

## 2022-05-12 LAB — CBC WITH DIFFERENTIAL
Basophils Absolute: 0.1 10*3/uL (ref 0.0–0.2)
Basos: 1 %
EOS (ABSOLUTE): 0.2 10*3/uL (ref 0.0–0.4)
Eos: 2 %
Hematocrit: 38 % (ref 34.0–46.6)
Hemoglobin: 12.5 g/dL (ref 11.1–15.9)
Immature Grans (Abs): 0.1 10*3/uL (ref 0.0–0.1)
Immature Granulocytes: 1 %
Lymphocytes Absolute: 3.5 10*3/uL — ABNORMAL HIGH (ref 0.7–3.1)
Lymphs: 32 %
MCH: 28 pg (ref 26.6–33.0)
MCHC: 32.9 g/dL (ref 31.5–35.7)
MCV: 85 fL (ref 79–97)
Monocytes Absolute: 0.5 10*3/uL (ref 0.1–0.9)
Monocytes: 5 %
Neutrophils Absolute: 6.5 10*3/uL (ref 1.4–7.0)
Neutrophils: 59 %
RBC: 4.46 x10E6/uL (ref 3.77–5.28)
RDW: 13.1 % (ref 11.7–15.4)
WBC: 10.9 10*3/uL — ABNORMAL HIGH (ref 3.4–10.8)

## 2022-05-12 LAB — CMP14+EGFR
ALT: 12 IU/L (ref 0–32)
AST: 11 IU/L (ref 0–40)
Albumin/Globulin Ratio: 1.8 (ref 1.2–2.2)
Albumin: 4.5 g/dL (ref 3.9–4.9)
Alkaline Phosphatase: 85 IU/L (ref 44–121)
BUN/Creatinine Ratio: 26 (ref 12–28)
BUN: 25 mg/dL (ref 8–27)
Bilirubin Total: 0.4 mg/dL (ref 0.0–1.2)
CO2: 23 mmol/L (ref 20–29)
Calcium: 10 mg/dL (ref 8.7–10.3)
Chloride: 102 mmol/L (ref 96–106)
Creatinine, Ser: 0.96 mg/dL (ref 0.57–1.00)
Globulin, Total: 2.5 g/dL (ref 1.5–4.5)
Glucose: 92 mg/dL (ref 70–99)
Potassium: 3.7 mmol/L (ref 3.5–5.2)
Sodium: 141 mmol/L (ref 134–144)
Total Protein: 7 g/dL (ref 6.0–8.5)
eGFR: 65 mL/min/{1.73_m2} (ref >59)

## 2022-05-12 LAB — HEMOGLOBIN A1C
Est. average glucose Bld gHb Est-mCnc: 114 mg/dL
Hgb A1c MFr Bld: 5.6 % (ref 4.8–5.6)

## 2022-05-12 LAB — LIPID PANEL
Chol/HDL Ratio: 3.1 ratio (ref 0.0–4.4)
Cholesterol, Total: 156 mg/dL (ref 100–199)
HDL: 51 mg/dL (ref >39)
LDL Chol Calc (NIH): 76 mg/dL (ref 0–99)
Triglycerides: 171 mg/dL — ABNORMAL HIGH (ref 0–149)
VLDL Cholesterol Cal: 29 mg/dL (ref 5–40)

## 2022-05-12 LAB — TSH: TSH: 5.9 u[IU]/mL — ABNORMAL HIGH (ref 0.450–4.500)

## 2022-05-14 ENCOUNTER — Encounter: Payer: Self-pay | Admitting: Internal Medicine

## 2022-05-14 ENCOUNTER — Ambulatory Visit: Payer: Medicare PPO | Admitting: Internal Medicine

## 2022-05-14 VITALS — BP 140/70 | HR 85 | Temp 97.4°F | Ht 66.0 in | Wt 224.6 lb

## 2022-05-14 DIAGNOSIS — G4733 Obstructive sleep apnea (adult) (pediatric): Secondary | ICD-10-CM

## 2022-05-14 DIAGNOSIS — E782 Mixed hyperlipidemia: Secondary | ICD-10-CM

## 2022-05-14 DIAGNOSIS — F32A Depression, unspecified: Secondary | ICD-10-CM | POA: Insufficient documentation

## 2022-05-14 DIAGNOSIS — J439 Emphysema, unspecified: Secondary | ICD-10-CM | POA: Diagnosis not present

## 2022-05-14 DIAGNOSIS — F3289 Other specified depressive episodes: Secondary | ICD-10-CM | POA: Diagnosis not present

## 2022-05-14 DIAGNOSIS — R7989 Other specified abnormal findings of blood chemistry: Secondary | ICD-10-CM

## 2022-05-14 DIAGNOSIS — I10 Essential (primary) hypertension: Secondary | ICD-10-CM

## 2022-05-14 DIAGNOSIS — E669 Obesity, unspecified: Secondary | ICD-10-CM

## 2022-05-14 DIAGNOSIS — J449 Chronic obstructive pulmonary disease, unspecified: Secondary | ICD-10-CM | POA: Insufficient documentation

## 2022-05-14 MED ORDER — WEGOVY 1 MG/0.5ML ~~LOC~~ SOAJ: 1.0000 mg | SUBCUTANEOUS | 2 refills | Status: DC

## 2022-05-14 NOTE — Progress Notes (Signed)
Established Patient Office Visit  Subjective:  Patient ID: Lauren Lynch, female    DOB: [birth year]  Age: 67 y.o. MRN: [id number]  Chief Complaint  Patient presents with   Follow-up    3 month lab results    No new complaints, here for lab review and medication refills. Labs reviewed and notable for elevated TSH and triglycerides. Admits to stress from her daughter'Felix Meras recent Ca diagnosis with elevated systolic bp.     No other concerns at this time.   Past Medical History:  Diagnosis Date   COPD (chronic obstructive pulmonary disease) ([place])    Hypertension     No past surgical history on file.  Social History   Socioeconomic History   Marital status: Married    Spouse name: Not on file   Number of children: Not on file   Years of education: Not on file   Highest education level: Not on file  Occupational History   Not on file  Tobacco Use   Smoking status: Every Day   Smokeless tobacco: Never  Substance and Sexual Activity   Alcohol use: Not on file   Drug use: Not on file   Sexual activity: Not on file  Other Topics Concern   Not on file  Social History Narrative   Not on file   Social Determinants of Health   Financial Resource Strain: Not on file  Food Insecurity: Not on file  Transportation Needs: Not on file  Physical Activity: Not on file  Stress: Not on file  Social Connections: Not on file  Intimate Partner Violence: Not on file    Family History  Problem Relation Age of Onset   Breast cancer Neg Hx     No Known Allergies  Review of Systems  Constitutional: Negative.        Gained  16 lb weight  HENT: Negative.    Eyes: Negative.   Respiratory: Negative.    Cardiovascular: Negative.   Gastrointestinal: Negative.   Genitourinary: Negative.   Skin: Negative.   Neurological: Negative.   Endo/Heme/Allergies: Negative.        No labile thermals  Psychiatric/Behavioral:  Positive for depression.        Objective:   BP (!)  140/70   Pulse 85   Temp (!) 97.4 F (36.3 C) (Tympanic)   Ht 5\' 6"  (1.676 m)   Wt 224 lb 9.6 oz (101.9 kg)   SpO2 94%   BMI 36.25 kg/m   Vitals:   05/14/22 1000  BP: (!) 140/70  Pulse: 85  Temp: (!) 97.4 F (36.3 C)  Height: 5\' 6"  (1.676 m)  Weight: 224 lb 9.6 oz (101.9 kg)  SpO2: 94%  TempSrc: Tympanic  BMI (Calculated): 36.27    Physical Exam Vitals reviewed.  Constitutional:      General: She is not in acute distress.    Appearance: She is obese.  HENT:     Head: Normocephalic.     Nose: Nose normal.     Mouth/Throat:     Mouth: Mucous membranes are moist.  Eyes:     Extraocular Movements: Extraocular movements intact.     Pupils: Pupils are equal, round, and reactive to light.  Cardiovascular:     Rate and Rhythm: Normal rate and regular rhythm.     Heart sounds: No murmur heard. Pulmonary:     Effort: Pulmonary effort is normal.     Breath sounds: No rhonchi or rales.  Abdominal:  General: Abdomen is flat.     Palpations: There is no hepatomegaly, splenomegaly or mass.  Musculoskeletal:        General: Normal range of motion.     Cervical back: Normal range of motion. No tenderness.  Skin:    General: Skin is warm and dry.  Neurological:     General: No focal deficit present.     Mental Status: She is alert and oriented to person, place, and time.     Cranial Nerves: No cranial nerve deficit.     Motor: No weakness.  Psychiatric:        Mood and Affect: Mood normal.        Behavior: Behavior normal.      No results found for any visits on 05/14/22.  Recent Results (from the past 2160 hour(Hale Chalfin))  Hemoglobin A1c     Status: None   Collection Time: 05/11/22  8:59 AM  Result Value Ref Range   Hgb A1c MFr Bld 5.6 4.8 - 5.6 %    Comment:          Prediabetes: 5.7 - 6.4          Diabetes: >6.4          Glycemic control for adults with diabetes: <7.0    Est. average glucose Bld gHb Est-mCnc 114 mg/dL  TSH     Status: Abnormal   Collection  Time: 05/11/22  8:59 AM  Result Value Ref Range   TSH 5.900 (H) 0.450 - 4.500 uIU/mL  CMP14+EGFR     Status: None   Collection Time: 05/11/22  8:59 AM  Result Value Ref Range   Glucose 92 70 - 99 mg/dL   BUN 25 8 - 27 mg/dL   Creatinine, Ser 0.96 0.57 - 1.00 mg/dL   eGFR 65 >59 mL/min/1.73   BUN/Creatinine Ratio 26 12 - 28   Sodium 141 134 - 144 mmol/L   Potassium 3.7 3.5 - 5.2 mmol/L   Chloride 102 96 - 106 mmol/L   CO2 23 20 - 29 mmol/L   Calcium 10.0 8.7 - 10.3 mg/dL   Total Protein 7.0 6.0 - 8.5 g/dL   Albumin 4.5 3.9 - 4.9 g/dL   Globulin, Total 2.5 1.5 - 4.5 g/dL   Albumin/Globulin Ratio 1.8 1.2 - 2.2   Bilirubin Total 0.4 0.0 - 1.2 mg/dL   Alkaline Phosphatase 85 44 - 121 IU/L   AST 11 0 - 40 IU/L   ALT 12 0 - 32 IU/L  Lipid panel     Status: Abnormal   Collection Time: 05/11/22  8:59 AM  Result Value Ref Range   Cholesterol, Total 156 100 - 199 mg/dL   Triglycerides 171 (H) 0 - 149 mg/dL   HDL 51 >39 mg/dL   VLDL Cholesterol Cal 29 5 - 40 mg/dL   LDL Chol Calc (NIH) 76 0 - 99 mg/dL   Chol/HDL Ratio 3.1 0.0 - 4.4 ratio    Comment:                                   T. Chol/HDL Ratio                                             Men  Women  1/2 Avg.Risk  3.4    3.3                                   Avg.Risk  5.0    4.4                                2X Avg.Risk  9.6    7.1                                3X Avg.Risk 23.4   11.0   CBC With Differential     Status: Abnormal   Collection Time: 05/11/22  8:59 AM  Result Value Ref Range   WBC 10.9 (H) 3.4 - 10.8 x10E3/uL   RBC 4.46 3.77 - 5.28 x10E6/uL   Hemoglobin 12.5 11.1 - 15.9 g/dL   Hematocrit 38.0 34.0 - 46.6 %   MCV 85 79 - 97 fL   MCH 28.0 26.6 - 33.0 pg   MCHC 32.9 31.5 - 35.7 g/dL   RDW 13.1 11.7 - 15.4 %   Neutrophils 59 Not Estab. %   Lymphs 32 Not Estab. %   Monocytes 5 Not Estab. %   Eos 2 Not Estab. %   Basos 1 Not Estab. %   Neutrophils Absolute 6.5 1.4 - 7.0 x10E3/uL    Lymphocytes Absolute 3.5 (H) 0.7 - 3.1 x10E3/uL   Monocytes Absolute 0.5 0.1 - 0.9 x10E3/uL   EOS (ABSOLUTE) 0.2 0.0 - 0.4 x10E3/uL   Basophils Absolute 0.1 0.0 - 0.2 x10E3/uL   Immature Granulocytes 1 Not Estab. %   Immature Grans (Abs) 0.1 0.0 - 0.1 x10E3/uL      Assessment & Plan:   Problem List Items Addressed This Visit       Cardiovascular and Mediastinum   Primary hypertension   Relevant Medications   chlorthalidone (HYGROTON) 25 MG tablet   furosemide (LASIX) 20 MG tablet   rosuvastatin (CRESTOR) 10 MG tablet   Other Relevant Orders   Comprehensive metabolic panel     Respiratory   OSA on CPAP   COPD (chronic obstructive pulmonary disease) ([facility])   Relevant Medications   COMBIVENT RESPIMAT 20-100 MCG/ACT AERS respimat     Other   Obesity (BMI 35.0-39.9 without comorbidity)   Relevant Medications   Semaglutide-Weight Management (WEGOVY) 1 MG/0.5ML SOAJ   Mixed hyperlipidemia - Primary   Relevant Medications   chlorthalidone (HYGROTON) 25 MG tablet   furosemide (LASIX) 20 MG tablet   rosuvastatin (CRESTOR) 10 MG tablet   Other Relevant Orders   Lipid panel   Depression   Other Visit Diagnoses     Abnormal thyroid blood test       Relevant Orders   T4   T4, Free   T3 Uptake   TSH       Return in about 3 months (around 08/14/2022) for fu with labs prior.   Total time spent: 30 minutes  Volanda Napoleon, MD  05/14/2022

## 2022-06-01 DIAGNOSIS — R0989 Other specified symptoms and signs involving the circulatory and respiratory systems: Secondary | ICD-10-CM | POA: Diagnosis not present

## 2022-06-01 DIAGNOSIS — J0100 Acute maxillary sinusitis, unspecified: Secondary | ICD-10-CM | POA: Diagnosis not present

## 2022-06-05 ENCOUNTER — Other Ambulatory Visit: Payer: Self-pay | Admitting: Internal Medicine

## 2022-07-08 ENCOUNTER — Other Ambulatory Visit: Payer: Self-pay | Admitting: Nurse Practitioner

## 2022-08-06 ENCOUNTER — Other Ambulatory Visit: Payer: Self-pay

## 2022-08-06 DIAGNOSIS — E669 Obesity, unspecified: Secondary | ICD-10-CM

## 2022-08-06 MED ORDER — WEGOVY 1 MG/0.5ML ~~LOC~~ SOAJ: 1.0000 mg | SUBCUTANEOUS | 3 refills | Status: DC

## 2022-08-10 ENCOUNTER — Other Ambulatory Visit: Payer: Self-pay | Admitting: Internal Medicine

## 2022-08-10 DIAGNOSIS — E669 Obesity, unspecified: Secondary | ICD-10-CM

## 2022-08-12 ENCOUNTER — Other Ambulatory Visit: Payer: Medicare PPO

## 2022-08-12 DIAGNOSIS — R7989 Other specified abnormal findings of blood chemistry: Secondary | ICD-10-CM

## 2022-08-13 LAB — T4, FREE: Free T4: 1.23 ng/dL (ref 0.82–1.77)

## 2022-08-13 LAB — T3 UPTAKE
Free Thyroxine Index: 2 (ref 1.2–4.9)
T3 Uptake Ratio: 27 % (ref 24–39)

## 2022-08-13 LAB — T4: T4, Total: 7.4 ug/dL (ref 4.5–12.0)

## 2022-08-17 ENCOUNTER — Ambulatory Visit: Payer: Medicare PPO | Admitting: Internal Medicine

## 2022-08-18 ENCOUNTER — Encounter: Payer: Self-pay | Admitting: Internal Medicine

## 2022-08-18 ENCOUNTER — Ambulatory Visit: Payer: Medicare PPO | Admitting: Internal Medicine

## 2022-08-18 VITALS — BP 118/70 | HR 98 | Ht 66.0 in | Wt 206.2 lb

## 2022-08-18 DIAGNOSIS — I10 Essential (primary) hypertension: Secondary | ICD-10-CM | POA: Diagnosis not present

## 2022-08-18 DIAGNOSIS — F329 Major depressive disorder, single episode, unspecified: Secondary | ICD-10-CM | POA: Diagnosis not present

## 2022-08-18 DIAGNOSIS — E782 Mixed hyperlipidemia: Secondary | ICD-10-CM

## 2022-08-18 DIAGNOSIS — E669 Obesity, unspecified: Secondary | ICD-10-CM | POA: Diagnosis not present

## 2022-08-18 DIAGNOSIS — R600 Localized edema: Secondary | ICD-10-CM | POA: Diagnosis not present

## 2022-08-18 MED ORDER — ROSUVASTATIN CALCIUM 20 MG PO TABS
20.0000 mg | ORAL_TABLET | Freq: Every day | ORAL | 0 refills | Status: DC
Start: 2022-08-18 — End: 2022-10-08

## 2022-08-18 MED ORDER — CHLORTHALIDONE 25 MG PO TABS
25.0000 mg | ORAL_TABLET | Freq: Every morning | ORAL | 1 refills | Status: DC
Start: 2022-08-18 — End: 2023-01-12

## 2022-08-18 MED ORDER — POTASSIUM CHLORIDE CRYS ER 20 MEQ PO TBCR: 20.0000 meq | EXTENDED_RELEASE_TABLET | Freq: Two times a day (BID) | ORAL | 1 refills | Status: DC

## 2022-08-18 MED ORDER — VENLAFAXINE HCL ER 150 MG PO CP24
150.0000 mg | ORAL_CAPSULE | Freq: Every morning | ORAL | 1 refills | Status: DC
Start: 2022-08-18 — End: 2023-01-12

## 2022-08-18 MED ORDER — FUROSEMIDE 20 MG PO TABS
20.0000 mg | ORAL_TABLET | Freq: Every morning | ORAL | 1 refills | Status: DC
Start: 2022-08-18 — End: 2023-01-12

## 2022-08-18 NOTE — Progress Notes (Signed)
Established Patient Office Visit  Subjective:  Patient ID: Lauren Lynch, female    DOB: [birth year]  Age: 67 y.o. MRN: [id number]  Chief Complaint  Patient presents with   Follow-up    3 month follow up with lab results    No new complaints, here for lab review and medication refills. Continues to lose weight on GLP-1. Normal thyroid profile on lab review.    No other concerns at this time.   Past Medical History:  Diagnosis Date   COPD (chronic obstructive pulmonary disease) ([facility])    Hypertension     No past surgical history on file.  Social History   Socioeconomic History   Marital status: Married    Spouse name: Not on file   Number of children: Not on file   Years of education: Not on file   Highest education level: Not on file  Occupational History   Not on file  Tobacco Use   Smoking status: Every Day   Smokeless tobacco: Never  Substance and Sexual Activity   Alcohol use: Not on file   Drug use: Not on file   Sexual activity: Not on file  Other Topics Concern   Not on file  Social History Narrative   Not on file   Social Determinants of Health   Financial Resource Strain: Not on file  Food Insecurity: Not on file  Transportation Needs: Not on file  Physical Activity: Not on file  Stress: Not on file  Social Connections: Not on file  Intimate Partner Violence: Not on file    Family History  Problem Relation Age of Onset   Breast cancer Neg Hx     No Known Allergies  Review of Systems  Constitutional:  Positive for weight loss (18 lb).  HENT: Negative.    Eyes: Negative.   Respiratory: Negative.    Cardiovascular: Negative.   Gastrointestinal: Negative.   Genitourinary: Negative.   Skin: Negative.   Neurological: Negative.   Endo/Heme/Allergies: Negative.        No labile thermals  Psychiatric/Behavioral:  Positive for depression.        Objective:   BP 118/70   Pulse 98   Ht 5\' 6"  (1.676 m)   Wt 206 lb 3.2 oz (93.5 kg)    SpO2 98%   BMI 33.28 kg/m   Vitals:   08/18/22 0845  BP: 118/70  Pulse: 98  Height: 5\' 6"  (1.676 m)  Weight: 206 lb 3.2 oz (93.5 kg)  SpO2: 98%  BMI (Calculated): 33.3    Physical Exam Vitals reviewed.  Constitutional:      General: She is not in acute distress.    Appearance: She is obese.  HENT:     Head: Normocephalic.     Nose: Nose normal.     Mouth/Throat:     Mouth: Mucous membranes are moist.  Eyes:     Extraocular Movements: Extraocular movements intact.     Pupils: Pupils are equal, round, and reactive to light.  Cardiovascular:     Rate and Rhythm: Normal rate and regular rhythm.     Heart sounds: No murmur heard. Pulmonary:     Effort: Pulmonary effort is normal.     Breath sounds: No rhonchi or rales.  Abdominal:     General: Abdomen is flat.     Palpations: There is no hepatomegaly, splenomegaly or mass.  Musculoskeletal:        General: Normal range of motion.     Cervical  back: Normal range of motion. No tenderness.  Skin:    General: Skin is warm and dry.  Neurological:     General: No focal deficit present.     Mental Status: She is alert and oriented to person, place, and time.     Cranial Nerves: No cranial nerve deficit.     Motor: No weakness.  Psychiatric:        Mood and Affect: Mood normal.        Behavior: Behavior normal.      No results found for any visits on 08/18/22.  Recent Results (from the past 2160 hour(Porcha Deblanc))  T4     Status: None   Collection Time: 08/12/22  8:44 AM  Result Value Ref Range   T4, Total 7.4 4.5 - 12.0 ug/dL  T4, Free     Status: None   Collection Time: 08/12/22  8:44 AM  Result Value Ref Range   Free T4 1.23 0.82 - 1.77 ng/dL  T3 Uptake     Status: None   Collection Time: 08/12/22  8:44 AM  Result Value Ref Range   T3 Uptake Ratio 27 24 - 39 %   Free Thyroxine Index 2.0 1.2 - 4.9      Assessment & Plan:  As per problem list  Problem List Items Addressed This Visit       Cardiovascular and  Mediastinum   Primary hypertension   Relevant Medications   chlorthalidone (HYGROTON) 25 MG tablet   furosemide (LASIX) 20 MG tablet   potassium chloride SA (KLOR-CON M) 20 MEQ tablet   rosuvastatin (CRESTOR) 20 MG tablet     Other   Obesity (BMI 35.0-39.9 without comorbidity) - Primary   Mixed hyperlipidemia   Relevant Medications   chlorthalidone (HYGROTON) 25 MG tablet   furosemide (LASIX) 20 MG tablet   rosuvastatin (CRESTOR) 20 MG tablet   Other Relevant Orders   Lipid panel   Comprehensive metabolic panel   Depression   Relevant Medications   venlafaxine XR (EFFEXOR-XR) 150 MG 24 hr capsule   Other Visit Diagnoses     Localized edema       Relevant Medications   furosemide (LASIX) 20 MG tablet       Return in about 3 months (around 11/18/2022) for awv with labs prior.   Total time spent: 20 minutes  Luna Fuse, MD  08/18/2022   This document may have been prepared by [facility] Voice Recognition software and as such may include unintentional dictation errors.

## 2022-10-03 DIAGNOSIS — J029 Acute pharyngitis, unspecified: Secondary | ICD-10-CM | POA: Diagnosis not present

## 2022-10-03 DIAGNOSIS — U071 COVID-19: Secondary | ICD-10-CM | POA: Diagnosis not present

## 2022-10-06 ENCOUNTER — Other Ambulatory Visit: Payer: Self-pay | Admitting: Internal Medicine

## 2022-10-06 DIAGNOSIS — E782 Mixed hyperlipidemia: Secondary | ICD-10-CM

## 2022-11-11 ENCOUNTER — Other Ambulatory Visit: Payer: Self-pay | Admitting: Internal Medicine

## 2022-11-11 DIAGNOSIS — I10 Essential (primary) hypertension: Secondary | ICD-10-CM

## 2022-11-12 ENCOUNTER — Other Ambulatory Visit: Payer: Medicare PPO

## 2022-11-12 DIAGNOSIS — E782 Mixed hyperlipidemia: Secondary | ICD-10-CM | POA: Diagnosis not present

## 2022-11-12 DIAGNOSIS — I10 Essential (primary) hypertension: Secondary | ICD-10-CM | POA: Diagnosis not present

## 2022-11-13 LAB — COMPREHENSIVE METABOLIC PANEL
ALT: 11 IU/L (ref 0–32)
AST: 10 IU/L (ref 0–40)
Albumin: 4.5 g/dL (ref 3.9–4.9)
Alkaline Phosphatase: 74 IU/L (ref 44–121)
BUN/Creatinine Ratio: 24 (ref 12–28)
BUN: 20 mg/dL (ref 8–27)
Bilirubin Total: 0.3 mg/dL (ref 0.0–1.2)
CO2: 25 mmol/L (ref 20–29)
Calcium: 10.1 mg/dL (ref 8.7–10.3)
Chloride: 98 mmol/L (ref 96–106)
Creatinine, Ser: 0.82 mg/dL (ref 0.57–1.00)
Globulin, Total: 2.5 g/dL (ref 1.5–4.5)
Glucose: 82 mg/dL (ref 70–99)
Potassium: 3.2 mmol/L — ABNORMAL LOW (ref 3.5–5.2)
Sodium: 140 mmol/L (ref 134–144)
Total Protein: 7 g/dL (ref 6.0–8.5)
eGFR: 79 mL/min/{1.73_m2} (ref >59)

## 2022-11-13 LAB — LIPID PANEL
Chol/HDL Ratio: 3.2 ratio (ref 0.0–4.4)
Cholesterol, Total: 141 mg/dL (ref 100–199)
HDL: 44 mg/dL (ref >39)
LDL Chol Calc (NIH): 74 mg/dL (ref 0–99)
Triglycerides: 133 mg/dL (ref 0–149)
VLDL Cholesterol Cal: 23 mg/dL (ref 5–40)

## 2022-11-17 ENCOUNTER — Ambulatory Visit (INDEPENDENT_AMBULATORY_CARE_PROVIDER_SITE_OTHER): Payer: Medicare PPO | Admitting: Internal Medicine

## 2022-11-17 ENCOUNTER — Encounter: Payer: Self-pay | Admitting: Internal Medicine

## 2022-11-17 VITALS — BP 119/73 | HR 76 | Ht 66.0 in | Wt 205.2 lb

## 2022-11-17 DIAGNOSIS — Z1331 Encounter for screening for depression: Secondary | ICD-10-CM

## 2022-11-17 DIAGNOSIS — G4733 Obstructive sleep apnea (adult) (pediatric): Secondary | ICD-10-CM

## 2022-11-17 DIAGNOSIS — E782 Mixed hyperlipidemia: Secondary | ICD-10-CM

## 2022-11-17 DIAGNOSIS — I10 Essential (primary) hypertension: Secondary | ICD-10-CM | POA: Diagnosis not present

## 2022-11-17 DIAGNOSIS — E669 Obesity, unspecified: Secondary | ICD-10-CM | POA: Diagnosis not present

## 2022-11-17 DIAGNOSIS — E876 Hypokalemia: Secondary | ICD-10-CM

## 2022-11-17 DIAGNOSIS — F329 Major depressive disorder, single episode, unspecified: Secondary | ICD-10-CM

## 2022-11-17 DIAGNOSIS — Z0001 Encounter for general adult medical examination with abnormal findings: Secondary | ICD-10-CM | POA: Diagnosis not present

## 2022-11-17 MED ORDER — POTASSIUM CHLORIDE CRYS ER 20 MEQ PO TBCR
20.0000 meq | EXTENDED_RELEASE_TABLET | Freq: Three times a day (TID) | ORAL | 2 refills | Status: DC
Start: 2022-11-17 — End: 2023-02-18

## 2022-11-17 MED ORDER — WEGOVY 1.7 MG/0.75ML ~~LOC~~ SOAJ
1.7000 mg | SUBCUTANEOUS | 2 refills | Status: AC
Start: 2022-11-17 — End: 2023-02-09

## 2022-11-17 NOTE — Progress Notes (Signed)
Established Patient Office Visit  Subjective:  Patient ID: Lauren Lynch, female    DOB: 09-02-1955  Age: 67 y.o. MRN: [id number]  No chief complaint on file.   No new complaints, here for AWV refer to quality metrics and scanned documents.   LDL and TC well controlled on lab review. Triglycerides also satisfactory. CMP notable for hypokalemia.    No other concerns at this time.   Past Medical History:  Diagnosis Date   COPD (chronic obstructive pulmonary disease) ([facility])    Hypertension     No past surgical history on file.  Social History   Socioeconomic History   Marital status: Married    Spouse name: Not on file   Number of children: Not on file   Years of education: Not on file   Highest education level: Not on file  Occupational History   Not on file  Tobacco Use   Smoking status: Every Day   Smokeless tobacco: Never  Substance and Sexual Activity   Alcohol use: Not on file   Drug use: Not on file   Sexual activity: Not on file  Other Topics Concern   Not on file  Social History Narrative   Not on file   Social Determinants of Health   Financial Resource Strain: Not on file  Food Insecurity: Not on file  Transportation Needs: Not on file  Physical Activity: Not on file  Stress: Not on file  Social Connections: Not on file  Intimate Partner Violence: Not on file    Family History  Problem Relation Age of Onset   Breast cancer Neg Hx     No Known Allergies  Review of Systems  Constitutional:  Positive for weight loss (1 lb).       Objective:   BP 119/73   Pulse 76   Ht 5\' 6"  (1.676 m)   Wt 205 lb 3.2 oz (93.1 kg)   SpO2 98%   BMI 33.12 kg/m   Vitals:   11/17/22 0841  BP: 119/73  Pulse: 76  Height: 5\' 6"  (1.676 m)  Weight: 205 lb 3.2 oz (93.1 kg)  SpO2: 98%  BMI (Calculated): 33.14    Physical Exam Vitals reviewed.  Constitutional:      General: She is not in acute distress.    Appearance: She is obese.  HENT:      Head: Normocephalic.     Nose: Nose normal.     Mouth/Throat:     Mouth: Mucous membranes are moist.  Eyes:     Extraocular Movements: Extraocular movements intact.     Pupils: Pupils are equal, round, and reactive to light.  Cardiovascular:     Rate and Rhythm: Normal rate and regular rhythm.     Heart sounds: No murmur heard. Pulmonary:     Effort: Pulmonary effort is normal.     Breath sounds: No rhonchi or rales.  Abdominal:     General: Abdomen is flat.     Palpations: There is no hepatomegaly, splenomegaly or mass.  Musculoskeletal:        General: Normal range of motion.     Cervical back: Normal range of motion. No tenderness.  Skin:    General: Skin is warm and dry.  Neurological:     General: No focal deficit present.     Mental Status: She is alert and oriented to person, place, and time.     Cranial Nerves: No cranial nerve deficit.     Motor: No  weakness.  Psychiatric:        Mood and Affect: Mood normal.        Behavior: Behavior normal.      No results found for any visits on 11/17/22.  Recent Results (from the past 2160 hour(Suzann Lazaro))  Lipid panel     Status: None   Collection Time: 11/12/22  9:26 AM  Result Value Ref Range   Cholesterol, Total 141 100 - 199 mg/dL   Triglycerides 161 0 - 149 mg/dL   HDL 44 >09 mg/dL   VLDL Cholesterol Cal 23 5 - 40 mg/dL   LDL Chol Calc (NIH) 74 0 - 99 mg/dL   Chol/HDL Ratio 3.2 0.0 - 4.4 ratio    Comment:                                   T. Chol/HDL Ratio                                             Men  Women                               1/2 Avg.Risk  3.4    3.3                                   Avg.Risk  5.0    4.4                                2X Avg.Risk  9.6    7.1                                3X Avg.Risk 23.4   11.0   Comprehensive metabolic panel     Status: Abnormal   Collection Time: 11/12/22  9:28 AM  Result Value Ref Range   Glucose 82 70 - 99 mg/dL   BUN 20 8 - 27 mg/dL   Creatinine, Ser 6.04 0.57 -  1.00 mg/dL   eGFR 79 >54 UJ/WJX/9.14   BUN/Creatinine Ratio 24 12 - 28   Sodium 140 134 - 144 mmol/L   Potassium 3.2 (L) 3.5 - 5.2 mmol/L   Chloride 98 96 - 106 mmol/L   CO2 25 20 - 29 mmol/L   Calcium 10.1 8.7 - 10.3 mg/dL   Total Protein 7.0 6.0 - 8.5 g/dL   Albumin 4.5 3.9 - 4.9 g/dL   Globulin, Total 2.5 1.5 - 4.5 g/dL   Bilirubin Total 0.3 0.0 - 1.2 mg/dL   Alkaline Phosphatase 74 44 - 121 IU/L   AST 10 0 - 40 IU/L   ALT 11 0 - 32 IU/L      Assessment & Plan:  As per problem list  Problem List Items Addressed This Visit       Cardiovascular and Mediastinum   Primary hypertension     Other   Obesity (BMI 35.0-39.9 without comorbidity)   Mixed hyperlipidemia - Primary   Depression    Return in about 3 months (around 02/16/2023) for fu with labs prior.  Total time spent: 40 minutes  Luna Fuse, MD  11/17/2022   This document may have been prepared by [facility] Voice Recognition software and as such may include unintentional dictation errors.

## 2022-11-23 DIAGNOSIS — G4730 Sleep apnea, unspecified: Secondary | ICD-10-CM | POA: Diagnosis not present

## 2022-12-02 ENCOUNTER — Ambulatory Visit
Admission: RE | Admit: 2022-12-02 | Discharge: 2022-12-02 | Disposition: A | Discharge status: Home / Self Care | Payer: Medicare PPO | Source: Ambulatory Visit | Attending: Internal Medicine | Admitting: Internal Medicine

## 2022-12-02 DIAGNOSIS — Z1231 Encounter for screening mammogram for malignant neoplasm of breast: Secondary | ICD-10-CM | POA: Insufficient documentation

## 2022-12-02 DIAGNOSIS — E669 Obesity, unspecified: Secondary | ICD-10-CM | POA: Insufficient documentation

## 2023-01-12 ENCOUNTER — Other Ambulatory Visit: Payer: Self-pay | Admitting: Internal Medicine

## 2023-01-12 DIAGNOSIS — I10 Essential (primary) hypertension: Secondary | ICD-10-CM

## 2023-01-12 DIAGNOSIS — F329 Major depressive disorder, single episode, unspecified: Secondary | ICD-10-CM

## 2023-01-12 DIAGNOSIS — R600 Localized edema: Secondary | ICD-10-CM

## 2023-01-13 ENCOUNTER — Other Ambulatory Visit: Payer: Self-pay

## 2023-01-14 MED ORDER — VITAMIN D-3 125 MCG (5000 UT) PO TABS: 5000.0000 [IU] | ORAL_TABLET | Freq: Every day | ORAL | 3 refills | Status: DC

## 2023-02-15 ENCOUNTER — Other Ambulatory Visit: Payer: Medicare PPO

## 2023-02-15 DIAGNOSIS — E782 Mixed hyperlipidemia: Secondary | ICD-10-CM | POA: Diagnosis not present

## 2023-02-16 LAB — COMPREHENSIVE METABOLIC PANEL
ALT: 11 [IU]/L (ref 0–32)
AST: 12 [IU]/L (ref 0–40)
Albumin: 4.1 g/dL (ref 3.9–4.9)
Alkaline Phosphatase: 69 [IU]/L (ref 44–121)
BUN/Creatinine Ratio: 13 (ref 12–28)
BUN: 11 mg/dL (ref 8–27)
Bilirubin Total: 0.3 mg/dL (ref 0.0–1.2)
CO2: 28 mmol/L (ref 20–29)
Calcium: 9.5 mg/dL (ref 8.7–10.3)
Chloride: 98 mmol/L (ref 96–106)
Creatinine, Ser: 0.82 mg/dL (ref 0.57–1.00)
Globulin, Total: 2.8 g/dL (ref 1.5–4.5)
Glucose: 83 mg/dL (ref 70–99)
Potassium: 3 mmol/L — ABNORMAL LOW (ref 3.5–5.2)
Sodium: 141 mmol/L (ref 134–144)
Total Protein: 6.9 g/dL (ref 6.0–8.5)
eGFR: 78 mL/min/{1.73_m2} (ref >59)

## 2023-02-16 LAB — LIPID PANEL
Chol/HDL Ratio: 3.2 {ratio} (ref 0.0–4.4)
Cholesterol, Total: 128 mg/dL (ref 100–199)
HDL: 40 mg/dL (ref >39)
LDL Chol Calc (NIH): 66 mg/dL (ref 0–99)
Triglycerides: 125 mg/dL (ref 0–149)
VLDL Cholesterol Cal: 22 mg/dL (ref 5–40)

## 2023-02-18 ENCOUNTER — Encounter: Payer: Self-pay | Admitting: Internal Medicine

## 2023-02-18 ENCOUNTER — Ambulatory Visit: Payer: Medicare PPO | Admitting: Internal Medicine

## 2023-02-18 VITALS — BP 130/78 | HR 85 | Ht 66.0 in | Wt 197.4 lb

## 2023-02-18 DIAGNOSIS — E782 Mixed hyperlipidemia: Secondary | ICD-10-CM | POA: Diagnosis not present

## 2023-02-18 DIAGNOSIS — I10 Essential (primary) hypertension: Secondary | ICD-10-CM

## 2023-02-18 DIAGNOSIS — Z23 Encounter for immunization: Secondary | ICD-10-CM

## 2023-02-18 DIAGNOSIS — E669 Obesity, unspecified: Secondary | ICD-10-CM

## 2023-02-18 DIAGNOSIS — E876 Hypokalemia: Secondary | ICD-10-CM

## 2023-02-18 MED ORDER — ROSUVASTATIN CALCIUM 20 MG PO TABS: 20.0000 mg | ORAL_TABLET | Freq: Every day | ORAL | 0 refills | Status: DC

## 2023-02-18 MED ORDER — POTASSIUM CHLORIDE CRYS ER 20 MEQ PO TBCR
40.0000 meq | EXTENDED_RELEASE_TABLET | Freq: Two times a day (BID) | ORAL | 2 refills | Status: DC
Start: 2023-02-18 — End: 2023-09-07

## 2023-02-18 MED ORDER — WEGOVY 1.7 MG/0.75ML ~~LOC~~ SOAJ: 1.7000 mg | SUBCUTANEOUS | 0 refills | Status: DC

## 2023-02-18 NOTE — Addendum Note (Signed)
Addended by: Matthew Saras on: 02/18/2023 10:52 AM   Modules accepted: Orders

## 2023-02-18 NOTE — Progress Notes (Signed)
Established Patient Office Visit  Subjective:  Patient ID: Lauren Lynch, female    DOB: 09-22-1955  Age: 67 y.o. MRN: [id number]  Chief Complaint  Patient presents with   Follow-up    3 month follow up    No new complaints, here for lab review and medication refills.    No other concerns at this time.   Past Medical History:  Diagnosis Date   COPD (chronic obstructive pulmonary disease) ([facility])    Hypertension     No past surgical history on file.  Social History   Socioeconomic History   Marital status: Married    Spouse name: Not on file   Number of children: Not on file   Years of education: Not on file   Highest education level: Not on file  Occupational History   Not on file  Tobacco Use   Smoking status: Every Day   Smokeless tobacco: Never  Substance and Sexual Activity   Alcohol use: Not on file   Drug use: Not on file   Sexual activity: Not on file  Other Topics Concern   Not on file  Social History Narrative   Not on file   Social Drivers of Health   Financial Resource Strain: Not on file  Food Insecurity: Not on file  Transportation Needs: Not on file  Physical Activity: Not on file  Stress: Not on file  Social Connections: Not on file  Intimate Partner Violence: Not on file    Family History  Problem Relation Age of Onset   Breast cancer Daughter 69    No Known Allergies  Outpatient Medications Prior to Visit  Medication Sig   chlorthalidone (HYGROTON) 25 MG tablet TAKE 1 TABLET BY MOUTH EVERY MORNING   Cholecalciferol (VITAMIN D-3) 125 MCG (5000 UT) TABS Take 5,000 Units by mouth daily.   COMBIVENT RESPIMAT 20-100 MCG/ACT AERS respimat Inhale 1 puff into the lungs every 6 (six) hours as needed.   furosemide (LASIX) 20 MG tablet TAKE 1 TABLET BY MOUTH EVERY MORNING   nystatin cream (MYCOSTATIN) APPLY TO AFFECTED AREA TWICE A DAY   venlafaxine XR (EFFEXOR-XR) 150 MG 24 hr capsule TAKE 1 CAPSULE BY MOUTH EVERY MORNING    [DISCONTINUED] potassium chloride SA (KLOR-CON M) 20 MEQ tablet Take 1 tablet (20 mEq total) by mouth 3 (three) times daily.   [DISCONTINUED] rosuvastatin (CRESTOR) 20 MG tablet TAKE 1 TABLET BY MOUTH ONCE DAILY   [DISCONTINUED] WEGOVY 1.7 MG/0.75ML SOAJ Inject 1.7 mg into the skin once a week.   No facility-administered medications prior to visit.    ROS     Objective:   BP 130/78   Pulse 85   Ht 5\' 6"  (1.676 m)   Wt 197 lb 6.4 oz (89.5 kg)   SpO2 95%   BMI 31.86 kg/m   Vitals:   02/18/23 1017  BP: 130/78  Pulse: 85  Height: 5\' 6"  (1.676 m)  Weight: 197 lb 6.4 oz (89.5 kg)  SpO2: 95%  BMI (Calculated): 31.88    Physical Exam   No results found for any visits on 02/18/23.  Recent Results (from the past 2160 hours)  Comprehensive metabolic panel     Status: Abnormal   Collection Time: 02/15/23  8:33 AM  Result Value Ref Range   Glucose 83 70 - 99 mg/dL   BUN 11 8 - 27 mg/dL   Creatinine, Ser 4.09 0.57 - 1.00 mg/dL   eGFR 78 >81 XB/JYN/8.29   BUN/Creatinine Ratio 13  12 - 28   Sodium 141 134 - 144 mmol/L   Potassium 3.0 (L) 3.5 - 5.2 mmol/L   Chloride 98 96 - 106 mmol/L   CO2 28 20 - 29 mmol/L   Calcium 9.5 8.7 - 10.3 mg/dL   Total Protein 6.9 6.0 - 8.5 g/dL   Albumin 4.1 3.9 - 4.9 g/dL   Globulin, Total 2.8 1.5 - 4.5 g/dL   Bilirubin Total 0.3 0.0 - 1.2 mg/dL   Alkaline Phosphatase 69 44 - 121 IU/L   AST 12 0 - 40 IU/L   ALT 11 0 - 32 IU/L  Lipid panel     Status: None   Collection Time: 02/15/23  8:34 AM  Result Value Ref Range   Cholesterol, Total 128 100 - 199 mg/dL   Triglycerides 536 0 - 149 mg/dL   HDL 40 >64 mg/dL   VLDL Cholesterol Cal 22 5 - 40 mg/dL   LDL Chol Calc (NIH) 66 0 - 99 mg/dL   Chol/HDL Ratio 3.2 0.0 - 4.4 ratio    Comment:                                   T. Chol/HDL Ratio                                             Men  Women                               1/2 Avg.Risk  3.4    3.3                                   Avg.Risk  5.0     4.4                                2X Avg.Risk  9.6    7.1                                3X Avg.Risk 23.4   11.0       Assessment & Plan:  As per problem list.  Problem List Items Addressed This Visit       Cardiovascular and Mediastinum   Primary hypertension   Relevant Medications   rosuvastatin (CRESTOR) 20 MG tablet     Other   Obesity (BMI 35.0-39.9 without comorbidity) - Primary   Relevant Medications   WEGOVY 1.7 MG/0.75ML SOAJ   Mixed hyperlipidemia   Relevant Medications   rosuvastatin (CRESTOR) 20 MG tablet   Other Visit Diagnoses       Hypokalemia       Relevant Medications   potassium chloride SA (KLOR-CON M) 20 MEQ tablet       Return in about 3 months (around 05/19/2023) for fu with labs prior.   Total time spent: 20 minutes  Luna Fuse, MD  02/18/2023   This document may have been prepared by [facility] Voice Recognition software and as such may include unintentional dictation errors.

## 2023-03-03 ENCOUNTER — Other Ambulatory Visit: Payer: Self-pay | Admitting: Internal Medicine

## 2023-03-07 ENCOUNTER — Other Ambulatory Visit: Payer: Self-pay | Admitting: Internal Medicine

## 2023-03-07 DIAGNOSIS — E669 Obesity, unspecified: Secondary | ICD-10-CM

## 2023-05-20 ENCOUNTER — Ambulatory Visit: Payer: Medicare PPO | Admitting: Internal Medicine

## 2023-06-06 ENCOUNTER — Other Ambulatory Visit

## 2023-06-06 DIAGNOSIS — E782 Mixed hyperlipidemia: Secondary | ICD-10-CM

## 2023-06-07 LAB — COMPREHENSIVE METABOLIC PANEL WITH GFR
ALT: 12 IU/L (ref 0–32)
AST: 12 IU/L (ref 0–40)
Albumin: 4.5 g/dL (ref 3.9–4.9)
Alkaline Phosphatase: 71 IU/L (ref 44–121)
BUN/Creatinine Ratio: 27 (ref 12–28)
BUN: 21 mg/dL (ref 8–27)
Bilirubin Total: 0.4 mg/dL (ref 0.0–1.2)
CO2: 26 mmol/L (ref 20–29)
Calcium: 10.1 mg/dL (ref 8.7–10.3)
Chloride: 100 mmol/L (ref 96–106)
Creatinine, Ser: 0.77 mg/dL (ref 0.57–1.00)
Globulin, Total: 2.7 g/dL (ref 1.5–4.5)
Glucose: 86 mg/dL (ref 70–99)
Potassium: 3.1 mmol/L — ABNORMAL LOW (ref 3.5–5.2)
Sodium: 140 mmol/L (ref 134–144)
Total Protein: 7.2 g/dL (ref 6.0–8.5)
eGFR: 84 mL/min/{1.73_m2} (ref >59)

## 2023-06-07 LAB — LIPID PANEL
Chol/HDL Ratio: 3.5 ratio (ref 0.0–4.4)
Cholesterol, Total: 153 mg/dL (ref 100–199)
HDL: 44 mg/dL (ref >39)
LDL Chol Calc (NIH): 82 mg/dL (ref 0–99)
Triglycerides: 155 mg/dL — ABNORMAL HIGH (ref 0–149)
VLDL Cholesterol Cal: 27 mg/dL (ref 5–40)

## 2023-06-08 ENCOUNTER — Encounter: Payer: Self-pay | Admitting: Internal Medicine

## 2023-06-08 ENCOUNTER — Ambulatory Visit: Payer: Medicare PPO | Admitting: Internal Medicine

## 2023-06-08 VITALS — BP 118/68 | HR 76 | Temp 97.8°F | Ht 66.0 in | Wt 199.6 lb

## 2023-06-08 DIAGNOSIS — E669 Obesity, unspecified: Secondary | ICD-10-CM | POA: Diagnosis not present

## 2023-06-08 DIAGNOSIS — Z1211 Encounter for screening for malignant neoplasm of colon: Secondary | ICD-10-CM

## 2023-06-08 DIAGNOSIS — E782 Mixed hyperlipidemia: Secondary | ICD-10-CM | POA: Diagnosis not present

## 2023-06-08 DIAGNOSIS — F329 Major depressive disorder, single episode, unspecified: Secondary | ICD-10-CM

## 2023-06-08 DIAGNOSIS — J439 Emphysema, unspecified: Secondary | ICD-10-CM

## 2023-06-08 DIAGNOSIS — I10 Essential (primary) hypertension: Secondary | ICD-10-CM

## 2023-06-08 MED ORDER — WEGOVY 2.4 MG/0.75ML ~~LOC~~ SOAJ: 2.4000 mg | SUBCUTANEOUS | 2 refills | Status: DC

## 2023-06-08 MED ORDER — ROSUVASTATIN CALCIUM 20 MG PO TABS
20.0000 mg | ORAL_TABLET | Freq: Every day | ORAL | 0 refills | Status: DC
Start: 2023-06-08 — End: 2023-07-25

## 2023-06-08 MED ORDER — SPIRIVA RESPIMAT 2.5 MCG/ACT IN AERS: 1.0000 | INHALATION_SPRAY | Freq: Every day | RESPIRATORY_TRACT | 2 refills | Status: DC

## 2023-06-08 MED ORDER — VENLAFAXINE HCL ER 150 MG PO CP24: 150.0000 mg | ORAL_CAPSULE | Freq: Every morning | ORAL | 1 refills | Status: DC

## 2023-06-08 NOTE — Progress Notes (Signed)
 Established Patient Office Visit  Subjective:  Patient ID: Lauren Lynch, female    DOB: 1955/09/18  Age: 68 y.o. MRN: [id number]  Chief Complaint  Patient presents with   Follow-up    3 month follow up    No new complaints, here for lab review and medication refills.     No other concerns at this time.   Past Medical History:  Diagnosis Date   COPD (chronic obstructive pulmonary disease) (HCC)    Hypertension     No past surgical history on file.  Social History   Socioeconomic History   Marital status: Married    Spouse name: Not on file   Number of children: Not on file   Years of education: Not on file   Highest education level: Not on file  Occupational History   Not on file  Tobacco Use   Smoking status: Every Day   Smokeless tobacco: Never  Substance and Sexual Activity   Alcohol use: Not on file   Drug use: Not on file   Sexual activity: Not on file  Other Topics Concern   Not on file  Social History Narrative   Not on file   Social Drivers of Health   Financial Resource Strain: Not on file  Food Insecurity: Not on file  Transportation Needs: Not on file  Physical Activity: Not on file  Stress: Not on file  Social Connections: Not on file  Intimate Partner Violence: Not on file    Family History  Problem Relation Age of Onset   Breast cancer Daughter 26    No Known Allergies  Outpatient Medications Prior to Visit  Medication Sig   chlorthalidone (HYGROTON) 25 MG tablet TAKE 1 TABLET BY MOUTH EVERY MORNING   Cholecalciferol (VITAMIN D-3) 125 MCG (5000 UT) TABS Take 5,000 Units by mouth daily.   COMBIVENT RESPIMAT 20-100 MCG/ACT AERS respimat Inhale 1 puff into the lungs every 6 (six) hours as needed.   furosemide (LASIX) 20 MG tablet TAKE 1 TABLET BY MOUTH EVERY MORNING   nystatin cream (MYCOSTATIN) APPLY TO AFFECTED AREA TWICE A DAY   potassium chloride SA (KLOR-CON M) 20 MEQ tablet Take 2 tablets (40 mEq total) by mouth 2 (two)  times daily.   [DISCONTINUED] rosuvastatin (CRESTOR) 20 MG tablet Take 1 tablet (20 mg total) by mouth daily.   [DISCONTINUED] Tiotropium Bromide Monohydrate (SPIRIVA RESPIMAT) 2.5 MCG/ACT AERS INHALE 1 PUFF INTO THE LUNGS EVERY MORNING AS DIRECTED.   [DISCONTINUED] venlafaxine XR (EFFEXOR-XR) 150 MG 24 hr capsule TAKE 1 CAPSULE BY MOUTH EVERY MORNING   [DISCONTINUED] WEGOVY 1.7 MG/0.75ML SOAJ INJECT ONE SYRINGEFUL INTO THE SKIN ONCE WEEKLY   No facility-administered medications prior to visit.    Review of Systems  HENT: Negative.    Eyes: Negative.   Respiratory: Negative.    Cardiovascular: Negative.   Gastrointestinal: Negative.   Genitourinary: Negative.   Skin: Negative.   Neurological: Negative.   Endo/Heme/Allergies: Negative.        No labile thermals  Psychiatric/Behavioral:  Positive for depression.        Objective:   BP 118/68   Pulse 76   Temp 97.8 F (36.6 C) (Tympanic)   Ht 5\' 6"  (1.676 m)   Wt 199 lb 9.6 oz (90.5 kg)   SpO2 97%   BMI 32.22 kg/m   Vitals:   06/08/23 1058  BP: 118/68  Pulse: 76  Temp: 97.8 F (36.6 C)  Height: 5\' 6"  (1.676 m)  Weight:  199 lb 9.6 oz (90.5 kg)  SpO2: 97%  TempSrc: Tympanic  BMI (Calculated): 32.23    Physical Exam Vitals reviewed.  Constitutional:      General: She is not in acute distress.    Appearance: She is obese.  HENT:     Head: Normocephalic.     Nose: Nose normal.     Mouth/Throat:     Mouth: Mucous membranes are moist.  Eyes:     Extraocular Movements: Extraocular movements intact.     Pupils: Pupils are equal, round, and reactive to light.  Cardiovascular:     Rate and Rhythm: Normal rate and regular rhythm.     Heart sounds: No murmur heard. Pulmonary:     Effort: Pulmonary effort is normal.     Breath sounds: No rhonchi or rales.  Abdominal:     General: Abdomen is flat.     Palpations: There is no hepatomegaly, splenomegaly or mass.  Musculoskeletal:        General: Normal range of  motion.     Cervical back: Normal range of motion. No tenderness.  Skin:    General: Skin is warm and dry.  Neurological:     General: No focal deficit present.     Mental Status: She is alert and oriented to person, place, and time.     Cranial Nerves: No cranial nerve deficit.     Motor: No weakness.  Psychiatric:        Mood and Affect: Mood normal.        Behavior: Behavior normal.      No results found for any visits on 06/08/23.  Recent Results (from the past 2160 hours)  Comprehensive metabolic panel     Status: Abnormal   Collection Time: 06/06/23  8:57 AM  Result Value Ref Range   Glucose 86 70 - 99 mg/dL   BUN 21 8 - 27 mg/dL   Creatinine, Ser 5.40 0.57 - 1.00 mg/dL   eGFR 84 >98 JX/BJY/7.82   BUN/Creatinine Ratio 27 12 - 28   Sodium 140 134 - 144 mmol/L   Potassium 3.1 (L) 3.5 - 5.2 mmol/L   Chloride 100 96 - 106 mmol/L   CO2 26 20 - 29 mmol/L   Calcium 10.1 8.7 - 10.3 mg/dL   Total Protein 7.2 6.0 - 8.5 g/dL   Albumin 4.5 3.9 - 4.9 g/dL   Globulin, Total 2.7 1.5 - 4.5 g/dL   Bilirubin Total 0.4 0.0 - 1.2 mg/dL   Alkaline Phosphatase 71 44 - 121 IU/L   AST 12 0 - 40 IU/L   ALT 12 0 - 32 IU/L  Lipid panel     Status: Abnormal   Collection Time: 06/06/23  8:57 AM  Result Value Ref Range   Cholesterol, Total 153 100 - 199 mg/dL   Triglycerides 956 (H) 0 - 149 mg/dL   HDL 44 >21 mg/dL   VLDL Cholesterol Cal 27 5 - 40 mg/dL   LDL Chol Calc (NIH) 82 0 - 99 mg/dL   Chol/HDL Ratio 3.5 0.0 - 4.4 ratio    Comment:                                   T. Chol/HDL Ratio  Men  Women                               1/2 Avg.Risk  3.4    3.3                                   Avg.Risk  5.0    4.4                                2X Avg.Risk  9.6    7.1                                3X Avg.Risk 23.4   11.0       Assessment & Plan:  As per problem list  Problem List Items Addressed This Visit       Respiratory   COPD  (chronic obstructive pulmonary disease) (HCC)   Relevant Medications   Tiotropium Bromide Monohydrate (SPIRIVA RESPIMAT) 2.5 MCG/ACT AERS     Other   Obesity (BMI 35.0-39.9 without comorbidity) - Primary   Relevant Medications   Tiotropium Bromide Monohydrate (SPIRIVA RESPIMAT) 2.5 MCG/ACT AERS   Semaglutide-Weight Management (WEGOVY) 2.4 MG/0.75ML SOAJ   Mixed hyperlipidemia   Relevant Medications   rosuvastatin (CRESTOR) 20 MG tablet   Depression   Relevant Medications   venlafaxine XR (EFFEXOR-XR) 150 MG 24 hr capsule   Other Visit Diagnoses       Colon cancer screening       Relevant Orders   Ambulatory referral to Gastroenterology       Return in about 3 months (around 09/07/2023) for fu with labs prior.   Total time spent: 20 minutes  Arzella Bitters, MD  06/08/2023   This document may have been prepared by [facility] Voice Recognition software and as such may include unintentional dictation errors.

## 2023-06-28 ENCOUNTER — Other Ambulatory Visit: Payer: Self-pay

## 2023-06-28 MED ORDER — TIRZEPATIDE-WEIGHT MANAGEMENT 2.5 MG/0.5ML ~~LOC~~ SOLN: 2.5000 mg | SUBCUTANEOUS | 0 refills | Status: DC

## 2023-07-01 ENCOUNTER — Other Ambulatory Visit: Payer: Self-pay

## 2023-07-01 MED ORDER — VITAMIN D-3 125 MCG (5000 UT) PO TABS: 5000.0000 [IU] | ORAL_TABLET | Freq: Every day | ORAL | 3 refills | Status: AC

## 2023-07-14 ENCOUNTER — Other Ambulatory Visit: Payer: Self-pay | Admitting: Internal Medicine

## 2023-07-23 ENCOUNTER — Other Ambulatory Visit: Payer: Self-pay | Admitting: Internal Medicine

## 2023-07-23 DIAGNOSIS — E782 Mixed hyperlipidemia: Secondary | ICD-10-CM

## 2023-07-27 ENCOUNTER — Ambulatory Visit (INDEPENDENT_AMBULATORY_CARE_PROVIDER_SITE_OTHER): Admitting: Internal Medicine

## 2023-07-27 ENCOUNTER — Encounter: Payer: Self-pay | Admitting: Internal Medicine

## 2023-07-27 VITALS — BP 105/70 | HR 75 | Temp 97.4°F | Ht 66.0 in | Wt 203.0 lb

## 2023-07-27 DIAGNOSIS — I10 Essential (primary) hypertension: Secondary | ICD-10-CM | POA: Diagnosis not present

## 2023-07-27 DIAGNOSIS — L858 Other specified epidermal thickening: Secondary | ICD-10-CM

## 2023-07-27 DIAGNOSIS — F329 Major depressive disorder, single episode, unspecified: Secondary | ICD-10-CM

## 2023-07-27 DIAGNOSIS — E782 Mixed hyperlipidemia: Secondary | ICD-10-CM | POA: Diagnosis not present

## 2023-07-27 DIAGNOSIS — E669 Obesity, unspecified: Secondary | ICD-10-CM | POA: Diagnosis not present

## 2023-07-27 NOTE — Progress Notes (Signed)
 Established Patient Office Visit  Subjective:  Patient ID: Lauren Lynch, female    DOB: 10/12/1955  Age: 68 y.o. MRN: [id number]  Chief Complaint  Patient presents with   Follow-up    Discuss dermatology referral    No new complaints, here for weight management and medication refills.     No other concerns at this time.   Past Medical History:  Diagnosis Date   COPD (chronic obstructive pulmonary disease) (HCC)    Hypertension     No past surgical history on file.  Social History   Socioeconomic History   Marital status: Married    Spouse name: Not on file   Number of children: Not on file   Years of education: Not on file   Highest education level: Not on file  Occupational History   Not on file  Tobacco Use   Smoking status: Every Day   Smokeless tobacco: Never  Substance and Sexual Activity   Alcohol use: Not on file   Drug use: Not on file   Sexual activity: Not on file  Other Topics Concern   Not on file  Social History Narrative   Not on file   Social Drivers of Health   Financial Resource Strain: Not on file  Food Insecurity: Not on file  Transportation Needs: Not on file  Physical Activity: Not on file  Stress: Not on file  Social Connections: Not on file  Intimate Partner Violence: Not on file    Family History  Problem Relation Age of Onset   Breast cancer Daughter 40    No Known Allergies  Outpatient Medications Prior to Visit  Medication Sig   chlorthalidone  (HYGROTON ) 25 MG tablet TAKE 1 TABLET BY MOUTH EVERY MORNING   Cholecalciferol (VITAMIN D -3) 125 MCG (5000 UT) TABS Take 5,000 Units by mouth daily.   COMBIVENT RESPIMAT 20-100 MCG/ACT AERS respimat Inhale 1 puff into the lungs every 6 (six) hours as needed.   furosemide  (LASIX ) 20 MG tablet TAKE 1 TABLET BY MOUTH EVERY MORNING   nystatin cream (MYCOSTATIN) APPLY TO AFFECTED AREA TWICE A DAY   potassium chloride  SA (KLOR-CON  M) 20 MEQ tablet Take 2 tablets (40 mEq total) by  mouth 2 (two) times daily.   rosuvastatin  (CRESTOR ) 20 MG tablet TAKE 1 TABLET BY MOUTH DAILY   Tiotropium Bromide Monohydrate  (SPIRIVA  RESPIMAT) 2.5 MCG/ACT AERS Inhale 1 puff into the lungs daily.   venlafaxine  XR (EFFEXOR -XR) 150 MG 24 hr capsule Take 1 capsule (150 mg total) by mouth every morning.   [DISCONTINUED] tirzepatide (ZEPBOUND) 2.5 MG/0.5ML injection vial Inject 2.5 mg into the skin once a week.   No facility-administered medications prior to visit.    Review of Systems  Constitutional:  Negative for weight loss.  HENT: Negative.    Eyes: Negative.   Respiratory: Negative.    Cardiovascular: Negative.   Gastrointestinal: Negative.   Genitourinary: Negative.   Skin: Negative.   Neurological: Negative.   Endo/Heme/Allergies: Negative.        No labile thermals  Psychiatric/Behavioral:  Positive for depression.        Objective:   BP 105/70   Pulse 75   Temp (!) 97.4 F (36.3 C)   Ht 5\' 6"  (1.676 m)   Wt 203 lb (92.1 kg)   SpO2 97%   BMI 32.77 kg/m   Vitals:   07/27/23 0831  BP: 105/70  Pulse: 75  Temp: (!) 97.4 F (36.3 C)  Height: 5\' 6"  (1.676 m)  Weight: 203 lb (92.1 kg)  SpO2: 97%  BMI (Calculated): 32.78    Physical Exam Vitals reviewed.  Constitutional:      General: She is not in acute distress.    Appearance: She is obese.  HENT:     Head: Normocephalic.     Nose: Nose normal.     Mouth/Throat:     Mouth: Mucous membranes are moist.  Eyes:     Extraocular Movements: Extraocular movements intact.     Pupils: Pupils are equal, round, and reactive to light.  Cardiovascular:     Rate and Rhythm: Normal rate and regular rhythm.     Heart sounds: No murmur heard. Pulmonary:     Effort: Pulmonary effort is normal.     Breath sounds: No rhonchi or rales.  Abdominal:     General: Abdomen is flat.     Palpations: There is no hepatomegaly, splenomegaly or mass.  Musculoskeletal:        General: Normal range of motion.     Cervical  back: Normal range of motion. No tenderness.  Skin:    General: Skin is warm and dry.     Findings: Lesion (keratoaconthoma of right hand) present.  Neurological:     General: No focal deficit present.     Mental Status: She is alert and oriented to person, place, and time.     Cranial Nerves: No cranial nerve deficit.     Motor: No weakness.  Psychiatric:        Mood and Affect: Mood normal.        Behavior: Behavior normal.      No results found for any visits on 07/27/23.  Recent Results (from the past 2160 hours)  Comprehensive metabolic panel     Status: Abnormal   Collection Time: 06/06/23  8:57 AM  Result Value Ref Range   Glucose 86 70 - 99 mg/dL   BUN 21 8 - 27 mg/dL   Creatinine, Ser 7.82 0.57 - 1.00 mg/dL   eGFR 84 >95 AO/ZHY/8.65   BUN/Creatinine Ratio 27 12 - 28   Sodium 140 134 - 144 mmol/L   Potassium 3.1 (L) 3.5 - 5.2 mmol/L   Chloride 100 96 - 106 mmol/L   CO2 26 20 - 29 mmol/L   Calcium  10.1 8.7 - 10.3 mg/dL   Total Protein 7.2 6.0 - 8.5 g/dL   Albumin 4.5 3.9 - 4.9 g/dL   Globulin, Total 2.7 1.5 - 4.5 g/dL   Bilirubin Total 0.4 0.0 - 1.2 mg/dL   Alkaline Phosphatase 71 44 - 121 IU/L   AST 12 0 - 40 IU/L   ALT 12 0 - 32 IU/L  Lipid panel     Status: Abnormal   Collection Time: 06/06/23  8:57 AM  Result Value Ref Range   Cholesterol, Total 153 100 - 199 mg/dL   Triglycerides 784 (H) 0 - 149 mg/dL   HDL 44 >69 mg/dL   VLDL Cholesterol Cal 27 5 - 40 mg/dL   LDL Chol Calc (NIH) 82 0 - 99 mg/dL   Chol/HDL Ratio 3.5 0.0 - 4.4 ratio    Comment:                                   T. Chol/HDL Ratio  Men  Women                               1/2 Avg.Risk  3.4    3.3                                   Avg.Risk  5.0    4.4                                2X Avg.Risk  9.6    7.1                                3X Avg.Risk 23.4   11.0       Assessment & Plan:  As per problem list  Problem List Items Addressed This  Visit       Cardiovascular and Mediastinum   Primary hypertension     Other   Obesity (BMI 35.0-39.9 without comorbidity) - Primary   Mixed hyperlipidemia   Depression   Other Visit Diagnoses       Keratoacanthoma of hand           Return if symptoms worsen or fail to improve.   Total time spent: 20 minutes  Arzella Bitters, MD  07/27/2023   This document may have been prepared by [facility] Voice Recognition software and as such may include unintentional dictation errors.

## 2023-08-11 ENCOUNTER — Encounter: Payer: Self-pay | Admitting: Internal Medicine

## 2023-08-12 ENCOUNTER — Other Ambulatory Visit: Payer: Self-pay

## 2023-08-12 MED ORDER — TIRZEPATIDE-WEIGHT MANAGEMENT 5 MG/0.5ML ~~LOC~~ SOAJ: 5.0000 mg | SUBCUTANEOUS | 0 refills | Status: DC

## 2023-08-12 MED ORDER — TIRZEPATIDE-WEIGHT MANAGEMENT 5 MG/0.5ML ~~LOC~~ SOLN: 5.0000 mg | SUBCUTANEOUS | 0 refills | Status: DC

## 2023-08-22 ENCOUNTER — Encounter: Payer: Self-pay | Admitting: Internal Medicine

## 2023-08-22 DIAGNOSIS — E782 Mixed hyperlipidemia: Secondary | ICD-10-CM

## 2023-08-22 DIAGNOSIS — I10 Essential (primary) hypertension: Secondary | ICD-10-CM

## 2023-08-22 DIAGNOSIS — C44622 Squamous cell carcinoma of skin of right upper limb, including shoulder: Secondary | ICD-10-CM | POA: Diagnosis not present

## 2023-08-22 DIAGNOSIS — R7989 Other specified abnormal findings of blood chemistry: Secondary | ICD-10-CM

## 2023-08-31 ENCOUNTER — Ambulatory Visit: Admitting: Internal Medicine

## 2023-08-31 ENCOUNTER — Encounter: Payer: Self-pay | Admitting: Internal Medicine

## 2023-09-05 ENCOUNTER — Other Ambulatory Visit

## 2023-09-05 DIAGNOSIS — R7989 Other specified abnormal findings of blood chemistry: Secondary | ICD-10-CM | POA: Diagnosis not present

## 2023-09-05 DIAGNOSIS — I10 Essential (primary) hypertension: Secondary | ICD-10-CM | POA: Diagnosis not present

## 2023-09-06 LAB — CBC WITH DIFF/PLATELET
Basophils Absolute: 0.1 x10E3/uL (ref 0.0–0.2)
Basos: 1 %
EOS (ABSOLUTE): 0.2 x10E3/uL (ref 0.0–0.4)
Eos: 2 %
Hematocrit: 37.2 % (ref 34.0–46.6)
Hemoglobin: 12.3 g/dL (ref 11.1–15.9)
Immature Grans (Abs): 0 x10E3/uL (ref 0.0–0.1)
Immature Granulocytes: 0 %
Lymphocytes Absolute: 3.1 x10E3/uL (ref 0.7–3.1)
Lymphs: 33 %
MCH: 29.1 pg (ref 26.6–33.0)
MCHC: 33.1 g/dL (ref 31.5–35.7)
MCV: 88 fL (ref 79–97)
Monocytes Absolute: 0.3 x10E3/uL (ref 0.1–0.9)
Monocytes: 4 %
Neutrophils Absolute: 5.6 x10E3/uL (ref 1.4–7.0)
Neutrophils: 60 %
Platelets: 155 x10E3/uL (ref 150–450)
RBC: 4.22 x10E6/uL (ref 3.77–5.28)
RDW: 13.2 % (ref 11.7–15.4)
WBC: 9.2 x10E3/uL (ref 3.4–10.8)

## 2023-09-06 LAB — COMPREHENSIVE METABOLIC PANEL WITH GFR
ALT: 12 IU/L (ref 0–32)
AST: 16 IU/L (ref 0–40)
Albumin: 4.2 g/dL (ref 3.9–4.9)
Alkaline Phosphatase: 79 IU/L (ref 44–121)
BUN/Creatinine Ratio: 26 (ref 12–28)
BUN: 18 mg/dL (ref 8–27)
Bilirubin Total: 0.3 mg/dL (ref 0.0–1.2)
CO2: 23 mmol/L (ref 20–29)
Calcium: 9.5 mg/dL (ref 8.7–10.3)
Chloride: 100 mmol/L (ref 96–106)
Creatinine, Ser: 0.69 mg/dL (ref 0.57–1.00)
Globulin, Total: 2.6 g/dL (ref 1.5–4.5)
Glucose: 90 mg/dL (ref 70–99)
Potassium: 3.3 mmol/L — ABNORMAL LOW (ref 3.5–5.2)
Sodium: 139 mmol/L (ref 134–144)
Total Protein: 6.8 g/dL (ref 6.0–8.5)
eGFR: 95 mL/min/1.73 (ref >59)

## 2023-09-06 LAB — TSH: TSH: 2.67 u[IU]/mL (ref 0.450–4.500)

## 2023-09-07 ENCOUNTER — Ambulatory Visit: Payer: Self-pay | Admitting: Internal Medicine

## 2023-09-07 ENCOUNTER — Encounter: Payer: Self-pay | Admitting: Internal Medicine

## 2023-09-07 ENCOUNTER — Ambulatory Visit (INDEPENDENT_AMBULATORY_CARE_PROVIDER_SITE_OTHER): Admitting: Internal Medicine

## 2023-09-07 VITALS — BP 112/64 | HR 90 | Temp 98.3°F | Ht 66.0 in | Wt 200.8 lb

## 2023-09-07 DIAGNOSIS — G4733 Obstructive sleep apnea (adult) (pediatric): Secondary | ICD-10-CM

## 2023-09-07 DIAGNOSIS — I872 Venous insufficiency (chronic) (peripheral): Secondary | ICD-10-CM | POA: Diagnosis not present

## 2023-09-07 DIAGNOSIS — R600 Localized edema: Secondary | ICD-10-CM

## 2023-09-07 DIAGNOSIS — I10 Essential (primary) hypertension: Secondary | ICD-10-CM | POA: Diagnosis not present

## 2023-09-07 DIAGNOSIS — I83813 Varicose veins of bilateral lower extremities with pain: Secondary | ICD-10-CM | POA: Insufficient documentation

## 2023-09-07 DIAGNOSIS — E876 Hypokalemia: Secondary | ICD-10-CM | POA: Insufficient documentation

## 2023-09-07 DIAGNOSIS — E782 Mixed hyperlipidemia: Secondary | ICD-10-CM | POA: Diagnosis not present

## 2023-09-07 DIAGNOSIS — E669 Obesity, unspecified: Secondary | ICD-10-CM

## 2023-09-07 MED ORDER — CHLORTHALIDONE 25 MG PO TABS: 25.0000 mg | ORAL_TABLET | Freq: Every morning | ORAL | 1 refills | Status: DC

## 2023-09-07 MED ORDER — ZEPBOUND 7.5 MG/0.5ML ~~LOC~~ SOAJ: 7.5000 mg | SUBCUTANEOUS | 2 refills | Status: DC

## 2023-09-07 MED ORDER — SPIRIVA RESPIMAT 2.5 MCG/ACT IN AERS: 1.0000 | INHALATION_SPRAY | Freq: Every day | RESPIRATORY_TRACT | 2 refills | Status: DC

## 2023-09-07 MED ORDER — FUROSEMIDE 20 MG PO TABS: 20.0000 mg | ORAL_TABLET | Freq: Every morning | ORAL | 1 refills | Status: DC

## 2023-09-07 MED ORDER — POTASSIUM CHLORIDE CRYS ER 20 MEQ PO TBCR
40.0000 meq | EXTENDED_RELEASE_TABLET | Freq: Two times a day (BID) | ORAL | 2 refills | Status: AC
Start: 2023-09-07 — End: 2023-12-06

## 2023-09-07 NOTE — Progress Notes (Signed)
 Established Patient Office Visit  Subjective:  Patient ID: Lauren Lynch, female    DOB: [birth year]  Age: 68 y.o. MRN: [id number]  Chief Complaint  Patient presents with   Results    3 month lab results     C/o bilateral ankle swelling especially towards the end of the day. Also here for lab review and medication refills. Continues to lose weight on GLP-1. Labs reviewed and notable for hypokalemia.     No other concerns at this time.   Past Medical History:  Diagnosis Date   COPD (chronic obstructive pulmonary disease) (HCC)    Hypertension     No past surgical history on file.  Social History   Socioeconomic History   Marital status: Married    Spouse name: Not on file   Number of children: Not on file   Years of education: Not on file   Highest education level: Not on file  Occupational History   Not on file  Tobacco Use   Smoking status: Some Days    Types: Cigarettes   Smokeless tobacco: Never  Substance and Sexual Activity   Alcohol use: Not on file   Drug use: Not on file   Sexual activity: Not on file  Other Topics Concern   Not on file  Social History Narrative   Not on file   Social Drivers of Health   Financial Resource Strain: Not on file  Food Insecurity: Not on file  Transportation Needs: Not on file  Physical Activity: Not on file  Stress: Not on file  Social Connections: Not on file  Intimate Partner Violence: Not on file    Family History  Problem Relation Age of Onset   Breast cancer Daughter 36    No Known Allergies  Outpatient Medications Prior to Visit  Medication Sig   chlorthalidone  (HYGROTON ) 25 MG tablet TAKE 1 TABLET BY MOUTH EVERY MORNING   Cholecalciferol (VITAMIN D -3) 125 MCG (5000 UT) TABS Take 5,000 Units by mouth daily.   furosemide  (LASIX ) 20 MG tablet TAKE 1 TABLET BY MOUTH EVERY MORNING   nystatin cream (MYCOSTATIN) APPLY TO AFFECTED AREA TWICE A DAY   potassium chloride  SA (KLOR-CON  M) 20 MEQ tablet Take 2  tablets (40 mEq total) by mouth 2 (two) times daily.   rosuvastatin  (CRESTOR ) 20 MG tablet TAKE 1 TABLET BY MOUTH DAILY   venlafaxine  XR (EFFEXOR -XR) 150 MG 24 hr capsule Take 1 capsule (150 mg total) by mouth every morning.   [DISCONTINUED] tirzepatide  (ZEPBOUND ) 5 MG/0.5ML Pen Inject 5 mg into the skin once a week. D/C the vial sent in error   COMBIVENT RESPIMAT 20-100 MCG/ACT AERS respimat Inhale 1 puff into the lungs every 6 (six) hours as needed. (Patient not taking: Reported on 09/07/2023)   Tiotropium Bromide Monohydrate  (SPIRIVA  RESPIMAT) 2.5 MCG/ACT AERS Inhale 1 puff into the lungs daily. (Patient not taking: Reported on 09/07/2023)   No facility-administered medications prior to visit.    Review of Systems  Constitutional:  Positive for weight loss (3 lbs).  HENT: Negative.    Eyes: Negative.   Respiratory: Negative.    Cardiovascular: Negative.   Gastrointestinal: Negative.   Genitourinary: Negative.   Skin: Negative.   Neurological: Negative.   Endo/Heme/Allergies: Negative.        No labile thermals  Psychiatric/Behavioral:  Positive for depression.        Objective:   BP 112/64   Pulse 90   Temp 98.3 F (36.8 C)   Ht  5' 6 (1.676 m)   Wt 200 lb 12.8 oz (91.1 kg)   SpO2 97%   BMI 32.41 kg/m   Vitals:   09/07/23 1005  BP: 112/64  Pulse: 90  Temp: 98.3 F (36.8 C)  Height: 5' 6 (1.676 m)  Weight: 200 lb 12.8 oz (91.1 kg)  SpO2: 97%  BMI (Calculated): 32.43    Physical Exam Vitals reviewed.  Constitutional:      General: She is not in acute distress.    Appearance: She is obese.  HENT:     Head: Normocephalic.     Nose: Nose normal.     Mouth/Throat:     Mouth: Mucous membranes are moist.  Eyes:     Extraocular Movements: Extraocular movements intact.     Pupils: Pupils are equal, round, and reactive to light.  Cardiovascular:     Rate and Rhythm: Normal rate and regular rhythm.     Heart sounds: No murmur heard.    Comments: Bulging  varicose veins on bilat calves and ankles. Pulmonary:     Effort: Pulmonary effort is normal.     Breath sounds: No rhonchi or rales.  Abdominal:     General: Abdomen is flat.     Palpations: There is no hepatomegaly, splenomegaly or mass.  Musculoskeletal:        General: Normal range of motion.     Cervical back: Normal range of motion. No tenderness.     Right lower leg: No edema.     Left lower leg: No edema.  Skin:    General: Skin is warm and dry.     Findings: Lesion (keratoaconthoma of right hand) present.  Neurological:     General: No focal deficit present.     Mental Status: She is alert and oriented to person, place, and time.     Cranial Nerves: No cranial nerve deficit.     Motor: No weakness.  Psychiatric:        Mood and Affect: Mood normal.        Behavior: Behavior normal.      No results found for any visits on 09/07/23.  Recent Results (from the past 2160 hours)  CBC With Diff/Platelet     Status: None   Collection Time: 09/05/23  8:37 AM  Result Value Ref Range   WBC 9.2 3.4 - 10.8 x10E3/uL   RBC 4.22 3.77 - 5.28 x10E6/uL   Hemoglobin 12.3 11.1 - 15.9 g/dL   Hematocrit 62.7 65.9 - 46.6 %   MCV 88 79 - 97 fL   MCH 29.1 26.6 - 33.0 pg   MCHC 33.1 31.5 - 35.7 g/dL   RDW 86.7 88.2 - 84.5 %   Platelets 155 150 - 450 x10E3/uL   Neutrophils 60 Not Estab. %   Lymphs 33 Not Estab. %   Monocytes 4 Not Estab. %   Eos 2 Not Estab. %   Basos 1 Not Estab. %   Neutrophils Absolute 5.6 1.4 - 7.0 x10E3/uL   Lymphocytes Absolute 3.1 0.7 - 3.1 x10E3/uL   Monocytes Absolute 0.3 0.1 - 0.9 x10E3/uL   EOS (ABSOLUTE) 0.2 0.0 - 0.4 x10E3/uL   Basophils Absolute 0.1 0.0 - 0.2 x10E3/uL   Immature Granulocytes 0 Not Estab. %   Immature Grans (Abs) 0.0 0.0 - 0.1 x10E3/uL  TSH     Status: None   Collection Time: 09/05/23  8:37 AM  Result Value Ref Range   TSH 2.670 0.450 - 4.500 uIU/mL  Comprehensive  metabolic panel with GFR     Status: Abnormal   Collection Time:  09/05/23  8:37 AM  Result Value Ref Range   Glucose 90 70 - 99 mg/dL   BUN 18 8 - 27 mg/dL   Creatinine, Ser 9.30 0.57 - 1.00 mg/dL   eGFR 95 >40 fO/fpw/8.26   BUN/Creatinine Ratio 26 12 - 28   Sodium 139 134 - 144 mmol/L   Potassium 3.3 (L) 3.5 - 5.2 mmol/L   Chloride 100 96 - 106 mmol/L   CO2 23 20 - 29 mmol/L   Calcium  9.5 8.7 - 10.3 mg/dL   Total Protein 6.8 6.0 - 8.5 g/dL   Albumin 4.2 3.9 - 4.9 g/dL   Globulin, Total 2.6 1.5 - 4.5 g/dL   Bilirubin Total 0.3 0.0 - 1.2 mg/dL   Alkaline Phosphatase 79 44 - 121 IU/L   AST 16 0 - 40 IU/L   ALT 12 0 - 32 IU/L      Assessment & Plan:  As per problem list. Declines referral for vein stripping, compression stockings recommended. Increase intake of citrus fruits.  Problem List Items Addressed This Visit       Cardiovascular and Mediastinum   Primary hypertension   Varicose veins of both lower extremities with pain   Venous insufficiency of both lower extremities - Primary     Other   Obesity (BMI 35.0-39.9 without comorbidity)   Mixed hyperlipidemia   Hypokalemia    No follow-ups on file.   Total time spent: 30 minutes  Sherrill Cinderella Perry, MD  09/07/2023   This document may have been prepared by [facility] Voice Recognition software and as such may include unintentional dictation errors.

## 2023-09-16 ENCOUNTER — Ambulatory Visit: Admitting: Certified Registered"

## 2023-09-16 ENCOUNTER — Ambulatory Visit
Admission: RE | Admit: 2023-09-16 | Discharge: 2023-09-16 | Disposition: A | Discharge status: Home / Self Care | Attending: Gastroenterology | Admitting: Gastroenterology

## 2023-09-16 ENCOUNTER — Encounter: Payer: Self-pay | Admitting: Gastroenterology

## 2023-09-16 ENCOUNTER — Encounter
Admission: RE | Disposition: A | Discharge status: Home / Self Care | Payer: Self-pay | Source: Home / Self Care | Attending: Gastroenterology

## 2023-09-16 ENCOUNTER — Other Ambulatory Visit: Payer: Self-pay

## 2023-09-16 DIAGNOSIS — K621 Rectal polyp: Secondary | ICD-10-CM | POA: Diagnosis not present

## 2023-09-16 DIAGNOSIS — G4730 Sleep apnea, unspecified: Secondary | ICD-10-CM | POA: Diagnosis not present

## 2023-09-16 DIAGNOSIS — F17210 Nicotine dependence, cigarettes, uncomplicated: Secondary | ICD-10-CM | POA: Diagnosis not present

## 2023-09-16 DIAGNOSIS — Z1211 Encounter for screening for malignant neoplasm of colon: Secondary | ICD-10-CM | POA: Diagnosis not present

## 2023-09-16 DIAGNOSIS — J449 Chronic obstructive pulmonary disease, unspecified: Secondary | ICD-10-CM | POA: Insufficient documentation

## 2023-09-16 DIAGNOSIS — I10 Essential (primary) hypertension: Secondary | ICD-10-CM | POA: Diagnosis not present

## 2023-09-16 HISTORY — PX: POLYPECTOMY: SHX149

## 2023-09-16 HISTORY — PX: COLONOSCOPY: SHX5424

## 2023-09-16 HISTORY — DX: Depression, unspecified: F32.A

## 2023-09-16 HISTORY — DX: Sleep apnea, unspecified: G47.30

## 2023-09-16 HISTORY — DX: Anxiety disorder, unspecified: F41.9

## 2023-09-16 SURGERY — COLONOSCOPY
Anesthesia: General

## 2023-09-16 MED ORDER — PROPOFOL 500 MG/50ML IV EMUL
INTRAVENOUS | Status: DC | PRN
Start: 2023-09-16 — End: 2023-09-16
  Administered 2023-09-16: 120 ug/kg/min via INTRAVENOUS
  Administered 2023-09-16: 100 mg via INTRAVENOUS

## 2023-09-16 MED ORDER — GLYCOPYRROLATE 0.2 MG/ML IJ SOLN
INTRAMUSCULAR | Status: DC | PRN
  Administered 2023-09-16: .2 mg via INTRAVENOUS

## 2023-09-16 MED ORDER — PROPOFOL 10 MG/ML IV BOLUS
INTRAVENOUS | Status: AC
Start: 2023-09-16 — End: 2023-09-16
  Filled 2023-09-16: qty 40

## 2023-09-16 MED ORDER — SODIUM CHLORIDE 0.9 % IV SOLN: INTRAVENOUS | Status: DC

## 2023-09-16 MED ORDER — LIDOCAINE HCL (PF) 2 % IJ SOLN
INTRAMUSCULAR | Status: DC | PRN
  Administered 2023-09-16: 100 mg via INTRADERMAL

## 2023-09-16 MED ORDER — LIDOCAINE HCL (PF) 2 % IJ SOLN
INTRAMUSCULAR | Status: AC
  Filled 2023-09-16: qty 5

## 2023-09-16 NOTE — Transfer of Care (Signed)
 Immediate Anesthesia Transfer of Care Note  Patient: Lauren Lynch  Procedure(s) Performed: COLONOSCOPY POLYPECTOMY, INTESTINE  Patient Location: PACU  Anesthesia Type:General  Level of Consciousness: drowsy  Airway & Oxygen Therapy: Patient Spontanous Breathing  Post-op Assessment: Report given to RN and Post -op Vital signs reviewed and stable  Post vital signs: Reviewed and stable  Last Vitals:  Vitals Value Taken Time  BP 125/77 0840  Temp 35.7 0840  Pulse 76 0840  Resp 16 0840  SpO2 98 0840    Last Pain:  Vitals:   09/16/23 0748  TempSrc: Oral  PainSc: 0-No pain         Complications: No notable events documented.

## 2023-09-16 NOTE — Anesthesia Postprocedure Evaluation (Signed)
 Anesthesia Post Note  Patient: Lauren Lynch  Procedure(s) Performed: COLONOSCOPY POLYPECTOMY, INTESTINE  Patient location during evaluation: Endoscopy Anesthesia Type: General Level of consciousness: awake and alert Pain management: pain level controlled Vital Signs Assessment: post-procedure vital signs reviewed and stable Respiratory status: spontaneous breathing, nonlabored ventilation and respiratory function stable Cardiovascular status: blood pressure returned to baseline and stable Postop Assessment: no apparent nausea or vomiting Anesthetic complications: no   No notable events documented.   Last Vitals:  Vitals:   09/16/23 0850 09/16/23 0900  BP: 120/67 121/69  Pulse: 71 67  Resp: (!) 21 18  Temp:    SpO2: 99% 98%    Last Pain:  Vitals:   09/16/23 0900  TempSrc:   PainSc: 0-No pain                 Fairy POUR Darvin Dials

## 2023-09-16 NOTE — H&P (Signed)
 [name], MD [facility], DHIP [address]  [place], [place] [address]  Main: [phone] Fax:  [phone] Pager: [phone]   Primary Care Physician:  Albina GORMAN Dine, MD Primary Gastroenterologist:  Dr. [name]  Pre-Procedure History & Physical: HPI:  Lauren Lynch is a 68 y.o. female is here for an colonoscopy.   Past Medical History:  Diagnosis Date   Anxiety    COPD (chronic obstructive pulmonary disease) (HCC)    Depression    Hypertension    Sleep apnea     Past Surgical History:  Procedure Laterality Date   ABDOMINAL HYSTERECTOMY      Prior to Admission medications   Medication Sig Start Date End Date Taking? Authorizing Provider  chlorthalidone  (HYGROTON ) 25 MG tablet Take 1 tablet (25 mg total) by mouth every morning. 09/07/23  Yes Albina GORMAN Dine, MD  Cholecalciferol (VITAMIN D -3) 125 MCG (5000 UT) TABS Take 5,000 Units by mouth daily. 07/01/23  Yes Albina GORMAN Dine, MD  furosemide  (LASIX ) 20 MG tablet Take 1 tablet (20 mg total) by mouth every morning. 09/07/23  Yes Albina GORMAN Dine, MD  potassium chloride  SA (KLOR-CON  M) 20 MEQ tablet Take 2 tablets (40 mEq total) by mouth 2 (two) times daily. 09/07/23 12/06/23 Yes Tejan-Sie, GORMAN Dine, MD  rosuvastatin  (CRESTOR ) 20 MG tablet TAKE 1 TABLET BY MOUTH DAILY 07/25/23  Yes Tejan-Sie, GORMAN Dine, MD  Tiotropium Bromide Monohydrate  (SPIRIVA  RESPIMAT) 2.5 MCG/ACT AERS Inhale 1 puff into the lungs daily. 09/07/23 12/06/23 Yes Albina GORMAN Dine, MD  tirzepatide  (ZEPBOUND ) 7.5 MG/0.5ML Pen Inject 7.5 mg into the skin once a week. 09/07/23 11/30/23 Yes Albina GORMAN Dine, MD  venlafaxine  XR (EFFEXOR -XR) 150 MG 24 hr capsule Take 1 capsule (150 mg total) by mouth every morning. 06/08/23  Yes Tejan-Sie, GORMAN Dine, MD  COMBIVENT RESPIMAT 20-100 MCG/ACT AERS respimat Inhale 1 puff into the lungs every 6 (six) hours as needed. Patient not taking: Reported on 09/07/2023 02/12/22    [provider]  nystatin cream (MYCOSTATIN) APPLY TO AFFECTED AREA TWICE A DAY 07/15/23   Albina GORMAN Dine, MD    Allergies as of 09/05/2023   (No Known Allergies)    Family History  Problem Relation Age of Onset   Breast cancer Daughter 18    Social History   Socioeconomic History   Marital status: Married    Spouse name: Not on file   Number of children: Not on file   Years of education: Not on file   Highest education level: Not on file  Occupational History   Not on file  Tobacco Use   Smoking status: Some Days    Types: Cigarettes   Smokeless tobacco: Never  Substance and Sexual Activity   Alcohol use: Yes    Comment: socially   Drug use: Never   Sexual activity: Not Currently  Other Topics Concern   Not on file  Social History Narrative   Not on file   Social Drivers of Health   Financial Resource Strain: Not on file  Food Insecurity: Not on file  Transportation Needs: Not on file  Physical Activity: Not on file  Stress: Not on file  Social Connections: Not on file  Intimate Partner Violence: Not on file    Review of Systems: See HPI, otherwise negative ROS  Physical Exam: BP 126/68   Pulse 71   Temp 97.9 F (36.6 C) (Oral)   Resp 18   Ht 5' 6 (  1.676 m)   Wt 87.3 kg   SpO2 97%   BMI 31.05 kg/m  General:   Alert,  pleasant and cooperative in NAD Head:  Normocephalic and atraumatic. Neck:  Supple; no masses or thyromegaly. Lungs:  Clear throughout to auscultation.    Heart:  Regular rate and rhythm. Abdomen:  Soft, nontender and nondistended. Normal bowel sounds, without guarding, and without rebound.   Neurologic:  Alert and  oriented x4;  grossly normal neurologically.  Impression/Plan: Lauren Lynch is here for an colonoscopy to be performed for colon cancer screening  Risks, benefits, limitations, and alternatives regarding  colonoscopy have been reviewed with the patient.  Questions have been answered.  All parties  agreeable.   Corinn Brooklyn, MD  09/16/2023, 8:06 AM

## 2023-09-16 NOTE — Anesthesia Preprocedure Evaluation (Signed)
 Anesthesia Evaluation  Patient identified by MRN, date of birth, ID band Patient awake    Reviewed: Allergy & Precautions, NPO status , Patient's Chart, lab work & pertinent test results  Airway Mallampati: III  TM Distance: <3 FB Neck ROM: full    Dental  (+) Chipped, Poor Dentition   Pulmonary neg shortness of breath, sleep apnea , COPD   Pulmonary exam normal        Cardiovascular hypertension, Normal cardiovascular exam     Neuro/Psych  PSYCHIATRIC DISORDERS      negative neurological ROS     GI/Hepatic negative GI ROS, Neg liver ROS,neg GERD  ,,  Endo/Other  negative endocrine ROS    Renal/GU negative Renal ROS  negative genitourinary   Musculoskeletal   Abdominal   Peds  Hematology negative hematology ROS (+)   Anesthesia Other Findings Past Medical History: No date: Anxiety No date: COPD (chronic obstructive pulmonary disease) (HCC) No date: Depression No date: Hypertension No date: Sleep apnea  Past Surgical History: No date: ABDOMINAL HYSTERECTOMY     Reproductive/Obstetrics negative OB ROS                              Anesthesia Physical Anesthesia Plan  ASA: 2  Anesthesia Plan: General   Post-op Pain Management:    Induction: Intravenous  PONV Risk Score and Plan: Propofol infusion and TIVA  Airway Management Planned: Natural Airway and Nasal Cannula  Additional Equipment:   Intra-op Plan:   Post-operative Plan:   Informed Consent: I have reviewed the patients History and Physical, chart, labs and discussed the procedure including the risks, benefits and alternatives for the proposed anesthesia with the patient or authorized representative who has indicated his/her understanding and acceptance.     Dental Advisory Given  Plan Discussed with: Anesthesiologist, CRNA and Surgeon  Anesthesia Plan Comments: (Patient consented for risks of anesthesia  including but not limited to:  - adverse reactions to medications - risk of airway placement if required - damage to eyes, teeth, lips or other oral mucosa - nerve damage due to positioning  - sore throat or hoarseness - Damage to heart, brain, nerves, lungs, other parts of body or loss of life  Patient voiced understanding and assent.)        Anesthesia Quick Evaluation

## 2023-09-16 NOTE — Op Note (Signed)
 [facility] Patient Name: Lauren Lynch Procedure Date: 09/16/2023 8:02 AM MRN: [id number] Account #: [id number] Date of Birth: December 18, 1955 Admit Type: Outpatient Age: 68 Room: [facility] ENDO ROOM 4 Gender: Female Note Status: Finalized Instrument Name: Colonoscope [id number] Procedure:             Colonoscopy Indications:           Screening for colorectal malignant neoplasm, Last                         colonoscopy: June 2006 Providers:             Corinn Jess Brooklyn MD, MD Referring MD:          Sherrill LABOR. Albina, MD (Referring MD) Medicines:             General Anesthesia Complications:         No immediate complications. Estimated blood loss: None. Procedure:             Pre-Anesthesia Assessment:                        - Prior to the procedure, a History and Physical was                         performed, and patient medications and allergies were                         reviewed. The patient is competent. The risks and                         benefits of the procedure and the sedation options and                         risks were discussed with the patient. All questions                         were answered and informed consent was obtained.                         Patient identification and proposed procedure were                         verified by the physician, the nurse, the                         anesthesiologist, the anesthetist and the technician                         in the pre-procedure area in the procedure room in the                         endoscopy suite. Mental Status Examination: alert and                         oriented. Airway Examination: normal oropharyngeal                         airway and neck mobility. Respiratory Examination:  clear to auscultation. CV Examination: normal.                         Prophylactic Antibiotics: The patient does not require                         prophylactic  antibiotics. Prior Anticoagulants: The                         patient has taken no anticoagulant or antiplatelet                         agents. ASA Grade Assessment: II - A patient with mild                         systemic disease. After reviewing the risks and                         benefits, the patient was deemed in satisfactory                         condition to undergo the procedure. The anesthesia                         plan was to use general anesthesia. Immediately prior                         to administration of medications, the patient was                         re-assessed for adequacy to receive sedatives. The                         heart rate, respiratory rate, oxygen saturations,                         blood pressure, adequacy of pulmonary ventilation, and                         response to care were monitored throughout the                         procedure. The physical status of the patient was                         re-assessed after the procedure.                        After obtaining informed consent, the colonoscope was                         passed under direct vision. Throughout the procedure,                         the patient's blood pressure, pulse, and oxygen                         saturations were monitored continuously. The  Colonoscope was introduced through the anus and                         advanced to the the cecum, identified by appendiceal                         orifice and ileocecal valve. The colonoscopy was                         performed with difficulty due to significant looping                         and the patient's body habitus. Successful completion                         of the procedure was aided by applying abdominal                         pressure. The patient tolerated the procedure well.                         The quality of the bowel preparation was evaluated                         using  the BBPS [facility] Bowel Preparation Scale) with                         scores of: Right Colon = 3, Transverse Colon = 3 and                         Left Colon = 3 (entire mucosa seen well with no                         residual staining, small fragments of stool or opaque                         liquid). The total BBPS score equals 9. The ileocecal                         valve, appendiceal orifice, and rectum were                         photographed. Findings:      The perianal and digital rectal examinations were normal. Pertinent       negatives include normal sphincter tone and no palpable rectal lesions.      Two sessile polyps were found in the distal rectum. The polyps were 3 to       4 mm in size. These polyps were removed with a cold snare. Resection and       retrieval were complete. Estimated blood loss: none.      The exam was otherwise without abnormality. Impression:            - Two 3 to 4 mm polyps in the distal rectum, removed                         with a cold snare. Resected and retrieved.                        -  The examination was otherwise normal. Recommendation:        - Discharge patient to home (with escort).                        - Resume previous diet today.                        - Continue present medications.                        - Await pathology results.                        - Repeat colonoscopy in 7-10 years for surveillance                         based on pathology results. Procedure Code(s):     --- Professional ---                        [phone], Colonoscopy, flexible; with removal of                         tumor(s), polyp(s), or other lesion(s) by snare                         technique Diagnosis Code(s):     --- Professional ---                        Z12.11, Encounter for screening for malignant neoplasm                         of colon                        D12.8, Benign neoplasm of rectum CPT copyright 2022 American Medical  Association. All rights reserved. The codes documented in this report are preliminary and upon coder review may  be revised to meet current compliance requirements. Dr. Corinn Brooklyn Corinn Jess Brooklyn MD, MD 09/16/2023 8:39:02 AM This report has been signed electronically. Number of Addenda: 0 Note Initiated On: 09/16/2023 8:02 AM Scope Withdrawal Time: 0 hours 13 minutes 32 seconds  Total Procedure Duration: 0 hours 21 minutes 48 seconds  Estimated Blood Loss:  Estimated blood loss: none.      [facility]

## 2023-09-19 LAB — SURGICAL PATHOLOGY

## 2023-09-21 ENCOUNTER — Ambulatory Visit: Payer: Self-pay | Admitting: Gastroenterology

## 2023-09-21 NOTE — Progress Notes (Signed)
 Recommend screening colonoscopy in 10 years  RV

## 2023-10-31 ENCOUNTER — Other Ambulatory Visit: Payer: Self-pay | Admitting: Internal Medicine

## 2023-12-02 ENCOUNTER — Encounter: Payer: Self-pay | Admitting: Internal Medicine

## 2023-12-02 DIAGNOSIS — D649 Anemia, unspecified: Secondary | ICD-10-CM

## 2023-12-06 ENCOUNTER — Other Ambulatory Visit

## 2023-12-06 DIAGNOSIS — D649 Anemia, unspecified: Secondary | ICD-10-CM | POA: Diagnosis not present

## 2023-12-06 LAB — CBC WITH DIFF/PLATELET
Basophils Absolute: 0.1 x10E3/uL (ref 0.0–0.2)
Basos: 1 %
EOS (ABSOLUTE): 0.1 x10E3/uL (ref 0.0–0.4)
Eos: 1 %
Hematocrit: 39.7 % (ref 34.0–46.6)
Hemoglobin: 13.1 g/dL (ref 11.1–15.9)
Immature Grans (Abs): 0.1 x10E3/uL (ref 0.0–0.1)
Immature Granulocytes: 1 %
Lymphocytes Absolute: 3.1 x10E3/uL (ref 0.7–3.1)
Lymphs: 30 %
MCH: 29.3 pg (ref 26.6–33.0)
MCHC: 33 g/dL (ref 31.5–35.7)
MCV: 89 fL (ref 79–97)
Monocytes Absolute: 0.4 x10E3/uL (ref 0.1–0.9)
Monocytes: 4 %
Neutrophils Absolute: 6.7 x10E3/uL (ref 1.4–7.0)
Neutrophils: 63 %
Platelets: 209 x10E3/uL (ref 150–450)
RBC: 4.47 x10E6/uL (ref 3.77–5.28)
RDW: 13.1 % (ref 11.7–15.4)
WBC: 10.4 x10E3/uL (ref 3.4–10.8)

## 2023-12-09 ENCOUNTER — Ambulatory Visit: Admitting: Internal Medicine

## 2023-12-09 ENCOUNTER — Ambulatory Visit: Payer: Self-pay | Admitting: Internal Medicine

## 2023-12-09 ENCOUNTER — Encounter: Payer: Self-pay | Admitting: Internal Medicine

## 2023-12-09 VITALS — BP 122/60 | HR 95 | Ht 66.0 in | Wt 207.0 lb

## 2023-12-09 DIAGNOSIS — M7701 Medial epicondylitis, right elbow: Secondary | ICD-10-CM

## 2023-12-09 DIAGNOSIS — F329 Major depressive disorder, single episode, unspecified: Secondary | ICD-10-CM

## 2023-12-09 DIAGNOSIS — J439 Emphysema, unspecified: Secondary | ICD-10-CM

## 2023-12-09 DIAGNOSIS — E669 Obesity, unspecified: Secondary | ICD-10-CM

## 2023-12-09 DIAGNOSIS — G4733 Obstructive sleep apnea (adult) (pediatric): Secondary | ICD-10-CM | POA: Diagnosis not present

## 2023-12-09 DIAGNOSIS — E876 Hypokalemia: Secondary | ICD-10-CM

## 2023-12-09 DIAGNOSIS — E782 Mixed hyperlipidemia: Secondary | ICD-10-CM | POA: Diagnosis not present

## 2023-12-09 MED ORDER — COMBIVENT RESPIMAT 20-100 MCG/ACT IN AERS: 1.0000 | INHALATION_SPRAY | Freq: Four times a day (QID) | RESPIRATORY_TRACT | 5 refills | Status: AC | PRN

## 2023-12-09 MED ORDER — POTASSIUM CHLORIDE CRYS ER 20 MEQ PO TBCR: 40.0000 meq | EXTENDED_RELEASE_TABLET | Freq: Two times a day (BID) | ORAL | 2 refills | Status: AC

## 2023-12-09 MED ORDER — ZEPBOUND 7.5 MG/0.5ML ~~LOC~~ SOAJ: 7.5000 mg | SUBCUTANEOUS | 2 refills | Status: DC

## 2023-12-09 MED ORDER — SPIRIVA RESPIMAT 2.5 MCG/ACT IN AERS: 1.0000 | INHALATION_SPRAY | Freq: Every day | RESPIRATORY_TRACT | 2 refills | Status: AC

## 2023-12-09 MED ORDER — VENLAFAXINE HCL ER 150 MG PO CP24: 150.0000 mg | ORAL_CAPSULE | Freq: Every morning | ORAL | 1 refills | Status: AC

## 2023-12-09 MED ORDER — MELOXICAM 15 MG PO TABS: 15.0000 mg | ORAL_TABLET | Freq: Every day | ORAL | 0 refills | Status: AC

## 2023-12-09 MED ORDER — PREDNISONE 20 MG PO TABS: 40.0000 mg | ORAL_TABLET | Freq: Every day | ORAL | 0 refills | Status: AC

## 2023-12-09 NOTE — Progress Notes (Signed)
 Established Patient Office Visit  Subjective:  Patient ID: Lauren Lynch, female    DOB: 13-Oct-1955  Age: 68 y.o. MRN: [id number]  Chief Complaint  Patient presents with   Follow-up    80mo    Complains of right elbow pain x several medications exacerbated by rotating her arm. here for lab review and medication refills. Labs reviewed and notable for  normal hgb.      No other concerns at this time.   Past Medical History:  Diagnosis Date   Anxiety    COPD (chronic obstructive pulmonary disease) (HCC)    Depression    Hypertension    Sleep apnea     Past Surgical History:  Procedure Laterality Date   ABDOMINAL HYSTERECTOMY     COLONOSCOPY N/A 09/16/2023   Procedure: COLONOSCOPY;  Surgeon: Unk Corinn Skiff, MD;  Location: [facility] ENDOSCOPY;  Service: Gastroenterology;  Laterality: N/A;   POLYPECTOMY  09/16/2023   Procedure: POLYPECTOMY, INTESTINE;  Surgeon: Unk Corinn Skiff, MD;  Location: [facility] ENDOSCOPY;  Service: Gastroenterology;;    Social History   Socioeconomic History   Marital status: Married    Spouse name: Not on file   Number of children: Not on file   Years of education: Not on file   Highest education level: Not on file  Occupational History   Not on file  Tobacco Use   Smoking status: Some Days    Types: Cigarettes   Smokeless tobacco: Never  Substance and Sexual Activity   Alcohol use: Yes    Comment: socially   Drug use: Never   Sexual activity: Not Currently  Other Topics Concern   Not on file  Social History Narrative   Not on file   Social Drivers of Health   Financial Resource Strain: Not on file  Food Insecurity: Not on file  Transportation Needs: Not on file  Physical Activity: Not on file  Stress: Not on file  Social Connections: Not on file  Intimate Partner Violence: Not on file    Family History  Problem Relation Age of Onset   Breast cancer Daughter 16    No Known Allergies  Outpatient Medications Prior to  Visit  Medication Sig   chlorthalidone  (HYGROTON ) 25 MG tablet Take 1 tablet (25 mg total) by mouth every morning.   Cholecalciferol (VITAMIN D -3) 125 MCG (5000 UT) TABS Take 5,000 Units by mouth daily.   furosemide  (LASIX ) 20 MG tablet Take 1 tablet (20 mg total) by mouth every morning.   nystatin cream (MYCOSTATIN) APPLY TO AFFECTED AREA TWICE A DAY   rosuvastatin  (CRESTOR ) 20 MG tablet TAKE 1 TABLET BY MOUTH DAILY   [DISCONTINUED] Ipratropium-Albuterol (COMBIVENT RESPIMAT) 20-100 MCG/ACT AERS respimat INHALE 1 PUFF INTO THE LUNGS EVERY 6 HOURS AS NEEDED FOR SHORTNESS OF BREATH   [DISCONTINUED] potassium chloride  SA (KLOR-CON  M) 20 MEQ tablet Take 2 tablets (40 mEq total) by mouth 2 (two) times daily.   [DISCONTINUED] Tiotropium Bromide Monohydrate  (SPIRIVA  RESPIMAT) 2.5 MCG/ACT AERS Inhale 1 puff into the lungs daily.   [DISCONTINUED] venlafaxine  XR (EFFEXOR -XR) 150 MG 24 hr capsule Take 1 capsule (150 mg total) by mouth every morning.   No facility-administered medications prior to visit.    Review of Systems  Constitutional:  Negative for weight loss.  HENT: Negative.    Eyes: Negative.   Respiratory: Negative.    Cardiovascular: Negative.   Gastrointestinal: Negative.   Genitourinary: Negative.   Skin: Negative.   Neurological: Negative.   Endo/Heme/Allergies: Negative.  No labile thermals  Psychiatric/Behavioral:  Positive for depression.        Objective:   BP 122/60   Pulse 95   Ht 5' 6 (1.676 m)   Wt 207 lb (93.9 kg)   SpO2 95%   BMI 33.41 kg/m   Vitals:   12/09/23 1520  BP: 122/60  Pulse: 95  Height: 5' 6 (1.676 m)  Weight: 207 lb (93.9 kg)  SpO2: 95%  BMI (Calculated): 33.43    Physical Exam Vitals reviewed.  Constitutional:      General: She is not in acute distress.    Appearance: She is obese.  HENT:     Head: Normocephalic.     Nose: Nose normal.     Mouth/Throat:     Mouth: Mucous membranes are moist.  Eyes:     Extraocular  Movements: Extraocular movements intact.     Pupils: Pupils are equal, round, and reactive to light.  Cardiovascular:     Rate and Rhythm: Normal rate and regular rhythm.     Heart sounds: No murmur heard.    Comments: Bulging varicose veins on bilat calves and ankles. Pulmonary:     Effort: Pulmonary effort is normal.     Breath sounds: No rhonchi or rales.  Abdominal:     General: Abdomen is flat.     Palpations: There is no hepatomegaly, splenomegaly or mass.  Musculoskeletal:        General: Normal range of motion.     Cervical back: Normal range of motion. No tenderness.     Right lower leg: No edema.     Left lower leg: No edema.  Skin:    General: Skin is warm and dry.     Findings: Lesion (keratoaconthoma of right hand) present.  Neurological:     General: No focal deficit present.     Mental Status: She is alert and oriented to person, place, and time.     Cranial Nerves: No cranial nerve deficit.     Motor: No weakness.  Psychiatric:        Mood and Affect: Mood normal.        Behavior: Behavior normal.      No results found for any visits on 12/09/23.      Assessment & Plan:  Lauren Lynch was seen today for follow-up.  Chronic obstructive pulmonary disease with emphysema, unspecified emphysema type (HCC) -     Combivent Respimat; Inhale 1 puff into the lungs every 6 (six) hours as needed for wheezing.  Dispense: 4 g; Refill: 5  Hypokalemia -     Potassium Chloride  Crys ER; Take 2 tablets (40 mEq total) by mouth 2 (two) times daily.  Dispense: 120 tablet; Refill: 2  OSA on CPAP -     Spiriva  Respimat; Inhale 1 puff into the lungs daily.  Dispense: 4 g; Refill: 2  Major depressive disorder, remission status unspecified, unspecified whether recurrent -     Venlafaxine  HCl ER; Take 1 capsule (150 mg total) by mouth every morning.  Dispense: 90 capsule; Refill: 1  Obesity (BMI 35.0-39.9 without comorbidity) -     Zepbound ; Inject 7.5 mg into the skin once a week.   Dispense: 2 mL; Refill: 2 -     TSH  Medial epicondylitis of right elbow -     predniSONE; Take 2 tablets (40 mg total) by mouth daily with breakfast for 5 days.  Dispense: 10 tablet; Refill: 0 -     Meloxicam; Take 1  tablet (15 mg total) by mouth daily.  Dispense: 30 tablet; Refill: 0  Mixed hyperlipidemia -     Comprehensive metabolic panel with GFR -     Lipid panel    Problem List Items Addressed This Visit       Respiratory   OSA on CPAP   Relevant Medications   Tiotropium Bromide (SPIRIVA  RESPIMAT) 2.5 MCG/ACT AERS   COPD (chronic obstructive pulmonary disease) (HCC) - Primary   Relevant Medications   Tiotropium Bromide (SPIRIVA  RESPIMAT) 2.5 MCG/ACT AERS   Ipratropium-Albuterol (COMBIVENT RESPIMAT) 20-100 MCG/ACT AERS respimat   predniSONE (DELTASONE) 20 MG tablet     Other   Obesity (BMI 35.0-39.9 without comorbidity)   Relevant Medications   tirzepatide  (ZEPBOUND ) 7.5 MG/0.5ML Pen   Other Relevant Orders   TSH   Mixed hyperlipidemia   Relevant Orders   Comprehensive metabolic panel with GFR   Lipid panel   Depression   Relevant Medications   venlafaxine  XR (EFFEXOR -XR) 150 MG 24 hr capsule   Hypokalemia   Relevant Medications   potassium chloride  SA (KLOR-CON  M) 20 MEQ tablet   Other Visit Diagnoses       Medial epicondylitis of right elbow       Relevant Medications   predniSONE (DELTASONE) 20 MG tablet   meloxicam (MOBIC) 15 MG tablet       Return in about 6 weeks (around 01/20/2024) for awv with labs prior.   Total time spent: 30 minutes. This time includes review of previous notes and results and patient face to face interaction during today'Arye Weyenberg visit.    Sherrill Cinderella Perry, MD  12/09/2023   This document may have been prepared by [facility] Voice Recognition software and as such may include unintentional dictation errors.

## 2024-01-11 ENCOUNTER — Other Ambulatory Visit

## 2024-01-11 DIAGNOSIS — E782 Mixed hyperlipidemia: Secondary | ICD-10-CM | POA: Diagnosis not present

## 2024-01-11 DIAGNOSIS — E669 Obesity, unspecified: Secondary | ICD-10-CM | POA: Diagnosis not present

## 2024-01-12 LAB — COMPREHENSIVE METABOLIC PANEL WITH GFR
ALT: 11 IU/L (ref 0–32)
AST: 14 IU/L (ref 0–40)
Albumin: 4.4 g/dL (ref 3.9–4.9)
Alkaline Phosphatase: 75 IU/L (ref 49–135)
BUN/Creatinine Ratio: 24 (ref 12–28)
BUN: 20 mg/dL (ref 8–27)
Bilirubin Total: 0.4 mg/dL (ref 0.0–1.2)
CO2: 23 mmol/L (ref 20–29)
Calcium: 10.1 mg/dL (ref 8.7–10.3)
Chloride: 101 mmol/L (ref 96–106)
Creatinine, Ser: 0.82 mg/dL (ref 0.57–1.00)
Globulin, Total: 2.6 g/dL (ref 1.5–4.5)
Glucose: 84 mg/dL (ref 70–99)
Potassium: 3.7 mmol/L (ref 3.5–5.2)
Sodium: 143 mmol/L (ref 134–144)
Total Protein: 7 g/dL (ref 6.0–8.5)
eGFR: 78 mL/min/1.73 (ref >59)

## 2024-01-12 LAB — LIPID PANEL
Chol/HDL Ratio: 3.2 ratio (ref 0.0–4.4)
Cholesterol, Total: 143 mg/dL (ref 100–199)
HDL: 45 mg/dL (ref >39)
LDL Chol Calc (NIH): 61 mg/dL (ref 0–99)
Triglycerides: 228 mg/dL — ABNORMAL HIGH (ref 0–149)
VLDL Cholesterol Cal: 37 mg/dL (ref 5–40)

## 2024-01-12 LAB — TSH: TSH: 2.46 u[IU]/mL (ref 0.450–4.500)

## 2024-01-16 ENCOUNTER — Ambulatory Visit: Admitting: Internal Medicine

## 2024-01-16 ENCOUNTER — Ambulatory Visit: Payer: Self-pay | Admitting: Internal Medicine

## 2024-01-16 VITALS — BP 134/68 | HR 93 | Ht 66.0 in | Wt 199.2 lb

## 2024-01-16 DIAGNOSIS — Z1331 Encounter for screening for depression: Secondary | ICD-10-CM | POA: Diagnosis not present

## 2024-01-16 DIAGNOSIS — E876 Hypokalemia: Secondary | ICD-10-CM | POA: Diagnosis not present

## 2024-01-16 DIAGNOSIS — E669 Obesity, unspecified: Secondary | ICD-10-CM

## 2024-01-16 DIAGNOSIS — Z0130 Encounter for examination of blood pressure without abnormal findings: Secondary | ICD-10-CM

## 2024-01-16 DIAGNOSIS — Z739 Problem related to life management difficulty, unspecified: Secondary | ICD-10-CM

## 2024-01-16 DIAGNOSIS — F331 Major depressive disorder, recurrent, moderate: Secondary | ICD-10-CM | POA: Diagnosis not present

## 2024-01-16 DIAGNOSIS — Z0001 Encounter for general adult medical examination with abnormal findings: Secondary | ICD-10-CM | POA: Diagnosis not present

## 2024-01-16 DIAGNOSIS — E782 Mixed hyperlipidemia: Secondary | ICD-10-CM | POA: Diagnosis not present

## 2024-01-16 NOTE — Progress Notes (Signed)
 Established Patient Office Visit  Subjective:  Patient ID: Lauren Lynch, female    DOB: 15-Jul-1955  Age: 68 y.o. MRN: [id number]  Chief Complaint  Patient presents with   Annual Exam    AWV lab results    No new complaints, here for AWV refer to quality metrics and scanned documents.  Also here for lab review and medication refills. Labs reviewed and notable for well controlled cholesterol but trigs are elevated. CMP notable for normalization of potassium while tsh is normal.      No other concerns at this time.   Past Medical History:  Diagnosis Date   Anxiety    COPD (chronic obstructive pulmonary disease) (HCC)    Depression    Hypertension    Sleep apnea     Past Surgical History:  Procedure Laterality Date   ABDOMINAL HYSTERECTOMY     COLONOSCOPY N/A 09/16/2023   Procedure: COLONOSCOPY;  Surgeon: Unk Corinn Skiff, MD;  Location: [facility] ENDOSCOPY;  Service: Gastroenterology;  Laterality: N/A;   POLYPECTOMY  09/16/2023   Procedure: POLYPECTOMY, INTESTINE;  Surgeon: Unk Corinn Skiff, MD;  Location: [facility] ENDOSCOPY;  Service: Gastroenterology;;    Social History   Socioeconomic History   Marital status: Married    Spouse name: Not on file   Number of children: Not on file   Years of education: Not on file   Highest education level: Not on file  Occupational History   Not on file  Tobacco Use   Smoking status: Some Days    Types: Cigarettes   Smokeless tobacco: Never  Substance and Sexual Activity   Alcohol use: Yes    Comment: socially   Drug use: Never   Sexual activity: Not Currently  Other Topics Concern   Not on file  Social History Narrative   Not on file   Social Drivers of Health   Financial Resource Strain: Not on file  Food Insecurity: Not on file  Transportation Needs: Not on file  Physical Activity: Not on file  Stress: Not on file  Social Connections: Not on file  Intimate Partner Violence: Not on file    Family History   Problem Relation Age of Onset   Breast cancer Daughter 24    No Known Allergies  Outpatient Medications Prior to Visit  Medication Sig   chlorthalidone  (HYGROTON ) 25 MG tablet Take 1 tablet (25 mg total) by mouth every morning.   Cholecalciferol (VITAMIN D -3) 125 MCG (5000 UT) TABS Take 5,000 Units by mouth daily.   furosemide  (LASIX ) 20 MG tablet Take 1 tablet (20 mg total) by mouth every morning.   Ipratropium-Albuterol (COMBIVENT RESPIMAT) 20-100 MCG/ACT AERS respimat Inhale 1 puff into the lungs every 6 (six) hours as needed for wheezing.   nystatin cream (MYCOSTATIN) APPLY TO AFFECTED AREA TWICE A DAY   potassium chloride  SA (KLOR-CON  M) 20 MEQ tablet Take 2 tablets (40 mEq total) by mouth 2 (two) times daily.   rosuvastatin  (CRESTOR ) 20 MG tablet TAKE 1 TABLET BY MOUTH DAILY   Tiotropium Bromide (SPIRIVA  RESPIMAT) 2.5 MCG/ACT AERS Inhale 1 puff into the lungs daily.   tirzepatide  (ZEPBOUND ) 7.5 MG/0.5ML Pen Inject 7.5 mg into the skin once a week.   venlafaxine  XR (EFFEXOR -XR) 150 MG 24 hr capsule Take 1 capsule (150 mg total) by mouth every morning.   No facility-administered medications prior to visit.    Review of Systems  Constitutional:  Positive for weight loss (8 lbs).  HENT: Negative.    Eyes:  Negative.   Respiratory: Negative.    Cardiovascular: Negative.   Gastrointestinal: Negative.   Genitourinary: Negative.   Skin: Negative.   Neurological: Negative.   Endo/Heme/Allergies: Negative.        No labile thermals  Psychiatric/Behavioral:  Positive for depression.        Objective:   BP 134/68   Pulse 93   Ht 5' 6 (1.676 m)   Wt 199 lb 3.2 oz (90.4 kg)   SpO2 96%   BMI 32.15 kg/m   Vitals:   01/16/24 1000  BP: 134/68  Pulse: 93  Height: 5' 6 (1.676 m)  Weight: 199 lb 3.2 oz (90.4 kg)  SpO2: 96%  BMI (Calculated): 32.17    Physical Exam Vitals reviewed.  Constitutional:      General: She is not in acute distress.    Appearance: She is  obese.  HENT:     Head: Normocephalic.     Nose: Nose normal.     Mouth/Throat:     Mouth: Mucous membranes are moist.  Eyes:     Extraocular Movements: Extraocular movements intact.     Pupils: Pupils are equal, round, and reactive to light.  Cardiovascular:     Rate and Rhythm: Normal rate and regular rhythm.     Heart sounds: No murmur heard.    Comments: Bulging varicose veins on bilat calves and ankles. Pulmonary:     Effort: Pulmonary effort is normal.     Breath sounds: No rhonchi or rales.  Abdominal:     General: Abdomen is flat.     Palpations: There is no hepatomegaly, splenomegaly or mass.  Musculoskeletal:        General: Normal range of motion.     Cervical back: Normal range of motion. No tenderness.     Right lower leg: No edema.     Left lower leg: No edema.  Skin:    General: Skin is warm and dry.     Findings: Lesion (keratoaconthoma of right hand) present.  Neurological:     General: No focal deficit present.     Mental Status: She is alert and oriented to person, place, and time.     Cranial Nerves: No cranial nerve deficit.     Motor: No weakness.  Psychiatric:        Mood and Affect: Mood normal.        Behavior: Behavior normal.      No results found for any visits on 01/16/24.  Recent Results (from the past 2160 hours)  CBC With Diff/Platelet     Status: None   Collection Time: 12/06/23  8:29 AM  Result Value Ref Range   WBC 10.4 3.4 - 10.8 x10E3/uL   RBC 4.47 3.77 - 5.28 x10E6/uL   Hemoglobin 13.1 11.1 - 15.9 g/dL   Hematocrit 60.2 65.9 - 46.6 %   MCV 89 79 - 97 fL   MCH 29.3 26.6 - 33.0 pg   MCHC 33.0 31.5 - 35.7 g/dL   RDW 86.8 88.2 - 84.5 %   Platelets 209 150 - 450 x10E3/uL   Neutrophils 63 Not Estab. %   Lymphs 30 Not Estab. %   Monocytes 4 Not Estab. %   Eos 1 Not Estab. %   Basos 1 Not Estab. %   Neutrophils Absolute 6.7 1.4 - 7.0 x10E3/uL   Lymphocytes Absolute 3.1 0.7 - 3.1 x10E3/uL   Monocytes Absolute 0.4 0.1 - 0.9  x10E3/uL   EOS (ABSOLUTE) 0.1 0.0 -  0.4 x10E3/uL   Basophils Absolute 0.1 0.0 - 0.2 x10E3/uL   Immature Granulocytes 1 Not Estab. %   Immature Grans (Abs) 0.1 0.0 - 0.1 x10E3/uL  Comprehensive metabolic panel with GFR     Status: None   Collection Time: 01/11/24  9:17 AM  Result Value Ref Range   Glucose 84 70 - 99 mg/dL   BUN 20 8 - 27 mg/dL   Creatinine, Ser 9.17 0.57 - 1.00 mg/dL   eGFR 78 >40 fO/fpw/8.26   BUN/Creatinine Ratio 24 12 - 28   Sodium 143 134 - 144 mmol/L   Potassium 3.7 3.5 - 5.2 mmol/L   Chloride 101 96 - 106 mmol/L   CO2 23 20 - 29 mmol/L   Calcium  10.1 8.7 - 10.3 mg/dL   Total Protein 7.0 6.0 - 8.5 g/dL   Albumin 4.4 3.9 - 4.9 g/dL   Globulin, Total 2.6 1.5 - 4.5 g/dL   Bilirubin Total 0.4 0.0 - 1.2 mg/dL   Alkaline Phosphatase 75 49 - 135 IU/L   AST 14 0 - 40 IU/L   ALT 11 0 - 32 IU/L  Lipid panel     Status: Abnormal   Collection Time: 01/11/24  9:17 AM  Result Value Ref Range   Cholesterol, Total 143 100 - 199 mg/dL   Triglycerides 771 (H) 0 - 149 mg/dL   HDL 45 >60 mg/dL   VLDL Cholesterol Cal 37 5 - 40 mg/dL   LDL Chol Calc (NIH) 61 0 - 99 mg/dL   Chol/HDL Ratio 3.2 0.0 - 4.4 ratio    Comment:                                   T. Chol/HDL Ratio                                             Men  Women                               1/2 Avg.Risk  3.4    3.3                                   Avg.Risk  5.0    4.4                                2X Avg.Risk  9.6    7.1                                3X Avg.Risk 23.4   11.0   TSH     Status: None   Collection Time: 01/11/24  9:17 AM  Result Value Ref Range   TSH 2.460 0.450 - 4.500 uIU/mL      Assessment & Plan:  Shawni was seen today for annual exam.  Hypokalemia  Obesity (BMI 35.0-39.9 without comorbidity)  Mixed hyperlipidemia  Moderate episode of recurrent major depressive disorder (HCC)    Problem List Items Addressed This Visit       Other   Obesity (BMI 35.0-39.9 without  comorbidity)    Mixed hyperlipidemia   Depression   Hypokalemia - Primary    Return in 2 months (on 03/17/2024).   Total time spent: 20 minutes. This time includes review of previous notes and results and patient face to face interaction during today'Letonia Stead visit.    Sherrill Cinderella Perry, MD  01/16/2024   This document may have been prepared by [facility] Voice Recognition software and as such may include unintentional dictation errors.

## 2024-01-20 ENCOUNTER — Other Ambulatory Visit: Payer: Self-pay | Admitting: Internal Medicine

## 2024-01-20 DIAGNOSIS — E782 Mixed hyperlipidemia: Secondary | ICD-10-CM

## 2024-01-30 ENCOUNTER — Other Ambulatory Visit: Payer: Self-pay | Admitting: Internal Medicine

## 2024-01-30 DIAGNOSIS — I10 Essential (primary) hypertension: Secondary | ICD-10-CM

## 2024-02-02 ENCOUNTER — Other Ambulatory Visit: Payer: Self-pay

## 2024-02-02 DIAGNOSIS — R600 Localized edema: Secondary | ICD-10-CM

## 2024-02-03 MED ORDER — FUROSEMIDE 20 MG PO TABS: 20.0000 mg | ORAL_TABLET | Freq: Every morning | ORAL | 1 refills | Status: AC

## 2024-02-06 ENCOUNTER — Other Ambulatory Visit: Payer: Self-pay

## 2024-02-06 DIAGNOSIS — E669 Obesity, unspecified: Secondary | ICD-10-CM

## 2024-02-06 MED ORDER — ZEPBOUND 7.5 MG/0.5ML ~~LOC~~ SOAJ: 7.5000 mg | SUBCUTANEOUS | 2 refills | Status: AC

## 2024-03-19 ENCOUNTER — Ambulatory Visit: Admitting: Internal Medicine

## 2024-03-26 ENCOUNTER — Ambulatory Visit: Admitting: Internal Medicine

## 2024-04-04 ENCOUNTER — Ambulatory Visit: Admitting: Internal Medicine
